# Patient Record
Sex: Female | Born: 1960 | Race: Black or African American | Hispanic: No | Marital: Single | Smoking: Never smoker
Health system: Southern US, Community
[De-identification: ages and names within clinical notes are randomized; demographics above are authoritative.]

## PROBLEM LIST (undated history)

## (undated) DIAGNOSIS — E039 Hypothyroidism, unspecified: Secondary | ICD-10-CM

## (undated) DIAGNOSIS — E119 Type 2 diabetes mellitus without complications: Secondary | ICD-10-CM

## (undated) DIAGNOSIS — T7840XA Allergy, unspecified, initial encounter: Secondary | ICD-10-CM

## (undated) HISTORY — PX: NASAL SINUS SURGERY: SHX719

## (undated) HISTORY — DX: Allergy, unspecified, initial encounter: T78.40XA

## (undated) HISTORY — DX: Type 2 diabetes mellitus without complications: E11.9

---

## 1997-09-02 ENCOUNTER — Other Ambulatory Visit: Admission: RE | Admit: 1997-09-02 | Discharge: 1997-09-02 | Payer: Self-pay | Admitting: Obstetrics and Gynecology

## 1997-12-24 ENCOUNTER — Ambulatory Visit (HOSPITAL_COMMUNITY): Admission: RE | Admit: 1997-12-24 | Discharge: 1997-12-24 | Payer: Self-pay | Admitting: Obstetrics and Gynecology

## 1997-12-24 ENCOUNTER — Encounter: Payer: Self-pay | Admitting: Obstetrics and Gynecology

## 1998-09-23 ENCOUNTER — Other Ambulatory Visit: Admission: RE | Admit: 1998-09-23 | Discharge: 1998-09-23 | Payer: Self-pay | Admitting: Obstetrics and Gynecology

## 1999-05-14 ENCOUNTER — Encounter: Payer: Self-pay | Admitting: Obstetrics and Gynecology

## 1999-05-14 ENCOUNTER — Ambulatory Visit (HOSPITAL_COMMUNITY): Admission: RE | Admit: 1999-05-14 | Discharge: 1999-05-14 | Payer: Self-pay | Admitting: Obstetrics and Gynecology

## 1999-09-23 ENCOUNTER — Other Ambulatory Visit: Admission: RE | Admit: 1999-09-23 | Discharge: 1999-09-23 | Payer: Self-pay | Admitting: Obstetrics and Gynecology

## 1999-12-11 ENCOUNTER — Ambulatory Visit (HOSPITAL_COMMUNITY): Admission: RE | Admit: 1999-12-11 | Discharge: 1999-12-11 | Payer: Self-pay | Admitting: Obstetrics and Gynecology

## 1999-12-11 ENCOUNTER — Encounter (INDEPENDENT_AMBULATORY_CARE_PROVIDER_SITE_OTHER): Payer: Self-pay | Admitting: Specialist

## 2000-06-07 ENCOUNTER — Encounter: Payer: Self-pay | Admitting: Obstetrics and Gynecology

## 2000-06-07 ENCOUNTER — Ambulatory Visit (HOSPITAL_COMMUNITY): Admission: RE | Admit: 2000-06-07 | Discharge: 2000-06-07 | Payer: Self-pay | Admitting: Obstetrics and Gynecology

## 2000-10-03 ENCOUNTER — Other Ambulatory Visit: Admission: RE | Admit: 2000-10-03 | Discharge: 2000-10-03 | Payer: Self-pay | Admitting: Obstetrics and Gynecology

## 2001-07-06 ENCOUNTER — Ambulatory Visit (HOSPITAL_COMMUNITY): Admission: RE | Admit: 2001-07-06 | Discharge: 2001-07-06 | Payer: Self-pay | Admitting: Obstetrics and Gynecology

## 2001-07-06 ENCOUNTER — Encounter: Payer: Self-pay | Admitting: Obstetrics and Gynecology

## 2001-11-17 ENCOUNTER — Other Ambulatory Visit: Admission: RE | Admit: 2001-11-17 | Discharge: 2001-11-17 | Payer: Self-pay | Admitting: Obstetrics and Gynecology

## 2002-04-16 ENCOUNTER — Encounter: Payer: Self-pay | Admitting: *Deleted

## 2002-04-16 ENCOUNTER — Ambulatory Visit (HOSPITAL_COMMUNITY): Admission: RE | Admit: 2002-04-16 | Discharge: 2002-04-16 | Payer: Self-pay | Admitting: *Deleted

## 2002-08-09 ENCOUNTER — Encounter: Payer: Self-pay | Admitting: Obstetrics and Gynecology

## 2002-08-09 ENCOUNTER — Ambulatory Visit (HOSPITAL_COMMUNITY): Admission: RE | Admit: 2002-08-09 | Discharge: 2002-08-09 | Payer: Self-pay | Admitting: Obstetrics and Gynecology

## 2003-09-02 ENCOUNTER — Other Ambulatory Visit: Admission: RE | Admit: 2003-09-02 | Discharge: 2003-09-02 | Payer: Self-pay | Admitting: Obstetrics and Gynecology

## 2004-08-17 ENCOUNTER — Encounter (INDEPENDENT_AMBULATORY_CARE_PROVIDER_SITE_OTHER): Payer: Self-pay | Admitting: *Deleted

## 2004-08-17 ENCOUNTER — Ambulatory Visit (HOSPITAL_COMMUNITY): Admission: RE | Admit: 2004-08-17 | Discharge: 2004-08-18 | Payer: Self-pay | Admitting: Otolaryngology

## 2004-08-28 ENCOUNTER — Ambulatory Visit (HOSPITAL_BASED_OUTPATIENT_CLINIC_OR_DEPARTMENT_OTHER): Admission: RE | Admit: 2004-08-28 | Discharge: 2004-08-28 | Payer: Self-pay | Admitting: Otolaryngology

## 2004-09-06 ENCOUNTER — Ambulatory Visit: Payer: Self-pay | Admitting: Internal Medicine

## 2004-10-05 ENCOUNTER — Other Ambulatory Visit: Admission: RE | Admit: 2004-10-05 | Discharge: 2004-10-05 | Payer: Self-pay | Admitting: Obstetrics and Gynecology

## 2004-12-07 ENCOUNTER — Emergency Department (HOSPITAL_COMMUNITY): Admission: EM | Admit: 2004-12-07 | Discharge: 2004-12-07 | Payer: Self-pay | Admitting: Family Medicine

## 2004-12-30 ENCOUNTER — Other Ambulatory Visit: Admission: RE | Admit: 2004-12-30 | Discharge: 2004-12-30 | Payer: Self-pay | Admitting: Obstetrics and Gynecology

## 2006-11-01 HISTORY — PX: ABDOMINAL HYSTERECTOMY: SHX81

## 2006-11-14 ENCOUNTER — Ambulatory Visit (HOSPITAL_COMMUNITY): Admission: RE | Admit: 2006-11-14 | Discharge: 2006-11-15 | Payer: Self-pay | Admitting: Obstetrics and Gynecology

## 2006-11-14 ENCOUNTER — Encounter (INDEPENDENT_AMBULATORY_CARE_PROVIDER_SITE_OTHER): Payer: Self-pay | Admitting: Obstetrics and Gynecology

## 2008-09-13 DIAGNOSIS — N809 Endometriosis, unspecified: Secondary | ICD-10-CM | POA: Insufficient documentation

## 2008-09-13 DIAGNOSIS — J329 Chronic sinusitis, unspecified: Secondary | ICD-10-CM | POA: Insufficient documentation

## 2008-11-20 ENCOUNTER — Encounter (INDEPENDENT_AMBULATORY_CARE_PROVIDER_SITE_OTHER): Payer: Self-pay | Admitting: *Deleted

## 2010-09-18 NOTE — Procedures (Signed)
NAME:  Nicole Gallagher, Nicole Gallagher                ACCOUNT NO.:  [id number]   MEDICAL RECORD NO.:  [id number]          PATIENT TYPE:  OUT   LOCATION:  SLEEP CENTER                 FACILITY:  [facility]   PHYSICIAN:  Clinton D. Maple Hudson, M.D. DATE OF BIRTH:  07-30-1960   DATE OF STUDY:  08/28/2004                              NOCTURNAL POLYSOMNOGRAM   STUDY DATE:  August 28, 2004   REFERRING PHYSICIAN:  Dr. Hermelinda Medicus   INDICATION FOR STUDY:  Hypersomnia with sleep apnea.  Epworth Sleepiness  Score 13/24, BMI 27, weight 153 pounds.   SLEEP ARCHITECTURE:  Total sleep time 332 minutes with sleep efficiency 91%.  Stage I was 5%, stage II 58%, stages III and IV 15%, REM was 22% of total  sleep time.  Sleep latency 2.5 minutes, REM latency 73 minutes, awake after  sleep onset 32 minutes, arousal index increased at 33.   RESPIRATORY DATA:  NPSG protocol.  Respiratory disturbance index (RDI, AHI)  3.4 obstructive events per hour indicating 3.4 obstructive events per hour  which is within normal limits.  There were 2 central apneas, 7 obstructive  apneas and 10 hypopneas.  Events were not positional.  REM RDI 9.   OXYGEN DATA:  Moderate snoring with oxygen desaturation to a nadir of 92%.  Mean oxygen saturation was 98% on room air.   CARDIAC DATA:  Normal sinus rhythm.   MOVEMENT/PARASOMNIA:  Occasional leg jerk, insignificant.   IMPRESSION/RECOMMENDATION:  1.  Occasional obstructive respiratory events, respiratory disturbance index      3.4 per hour which is within normal limits for an adult.  2.  Moderate snoring with normal oxygenation.      CDY/MEDQ  D:  09/06/2004 10:31:31  T:  09/06/2004 11:52:26  Job:  [id number]

## 2010-09-18 NOTE — Op Note (Signed)
NAME:  Nicole Gallagher, Nicole Gallagher                ACCOUNT NO.:  [id number]   MEDICAL RECORD NO.:  [id number]          PATIENT TYPE:  AMB   LOCATION:  SDC                           FACILITY:  [facility]   PHYSICIAN:  Guy Sandifer. Henderson Cloud, M.D. DATE OF BIRTH:  12/05/1960   DATE OF PROCEDURE:  11/14/2006  DATE OF DISCHARGE:                               OPERATIVE REPORT   PREOPERATIVE DIAGNOSES:  1. Uterine leiomyomata.  2. Menometrorrhagia.   POSTOPERATIVE DIAGNOSES:  1. Uterine leiomyomata.  2. Menometrorrhagia.  3. Pelvic adhesions.   PROCEDURES:  Laparoscopically-assisted vaginal hysterectomy and lysis of  adhesions.   SURGEON:  Harold Hedge, MD   ASSISTANT:  Candice Camp, MD   ANESTHESIA:  General with endotracheal intubation, Cristela Blue, MD   SPECIMENS:  Uterus to pathology.   ESTIMATED BLOOD LOSS:  150 mL   INDICATIONS AND CONSENT:  This patient is a 50 year old single black  female, G0, P0 with known endometriosis status post uterine myomectomy  with recurrent heavy bleeding.  After discussing the options, she is  admitted for laparoscopically-assisted vaginal hysterectomy and removal  of ovaries only if they distinctly abnormal.  Potential risks and  complications have been discussed preoperatively including but not  limited to infection, organ damage, bleeding requiring transfusion of  blood products with possible transfusion reaction, HIV and hepatitis  acquisition, DVT, PE, pneumonia, fistula formation, postoperative  dyspareunia, pelvic pain, and laparotomy.  All questions have been  answered, and consent is signed on the chart.   FINDINGS:  There is some omental adhesions to the anterior abdominal  wall above the level of the umbilicus.  This does not form any closed  loops.  In the pelvis, the uterus is about 6 weeks in size with at least  one 3 cm subserosal leiomyomata in the right uterine fundus.  They are  adherent to the pelvic sidewalls bilaterally.  There are adhesions of  the small bowel to the left uterine fundus.   PROCEDURE:  The patient is taken to the operating room where she is  identified, placed in dorsal supine position, and general anesthesia is  induced via endotracheal intubation.  She is then placed in the dorsal  lithotomy position where she is prepped, abdominally and vaginally,  bladder is straight-catheterized, and a Hulka tenaculum is placed in the  uterus as a manipulator.  She is draped in a sterile fashion.  The  infraumbilical and suprapubic areas injected in the midline with 0.5%  plain Marcaine.  An infraumbilical incision is made.  A disposable  Veress needle is then placed with a normal syringe and drop test.  Two  liters of gas are insufflated under low pressure with good tympany in  the right upper quadrant.  The Veress needle is removed, and a 10-11 XL  bladeless disposable trocar sleeve is placed using direct visualization  with the diagnostic laparoscope.  After placement, the operative  laparoscope is placed.  A small suprapubic incision is made in the  midline, and a 5 mm XL bladeless disposable trocar sleeve is placed  under direct visualization without difficulty.  The  above findings are  noted.  The adhesions of the bowel which are filmy in nature to the left  uterine fundus are taken down sharply.  The ovaries are mobilized  bilaterally with sharp and blunt dissection.  Hemostasis is maintained  on the ovaries with bipolar cautery.  This frees up the proximal  ligaments bilaterally.  The gyrus bipolar cautery cutting instrument is  then used to take down the proximal ligaments bilaterally to the level  of the vesicouterine peritoneum.  The vesicouterine peritoneum is then  taken down cephalolaterally sharply.  Suprapubic trocar sleeve is  removed; instruments are removed, and attention is turned to the vagina.  Posterior cul-de-sac is entered sharply.  Cervix is circumscribed with  unipolar cautery.  Mucosa is advanced  sharply and bluntly.  Then using  the gyrus bipolar cautery instrument, the uterosacral ligaments followed  by the cardinal ligaments and the uterine vessels were taken down  bilaterally.  Fundus is delivered posteriorly, and the proximal  ligaments are taken down.  The uterosacral ligaments are then plicated  the vagina bilaterally with separate sutures of 0 Monocryl.  All suture  will be 0 Monocryl unless otherwise designated.  Uterosacral ligaments  are then plicated in the midline with a third suture, and the cuff is  closed with figure-of-eights.  Foley catheter is placed, and the bladder  is draining clear urine as noted.  Attention is returned to the abdomen.  Pneumoperitoneum is recreated, and the suprapubic trocar sleeve is  reinserted under direct visualization.  Minor bleeding at peritoneal  edges is controlled with bipolar cautery.  Copious irrigation is carried  out.  Inspection of the reduced pneumoperitoneum reveals good  hemostasis.  All excess fluid is removed, and the suprapubic trocar  sleeve is removed.  Pneumoperitoneum is completely reduced, and the  umbilical trocar sleeve is removed.  Skin is closed with Dermabond in  both incisions.  All counts correct.  The patient is awakened and taken  to the recovery room in stable condition.      Guy Sandifer Henderson Cloud, M.D.  Electronically Signed     JET/MEDQ  D:  11/14/2006  T:  11/14/2006  Job:  [id number]

## 2010-09-18 NOTE — H&P (Signed)
[facility]  Patient:    Nicole Gallagher, Nicole Gallagher                         MRN: [id number] Adm. Date:  12/11/99 Attending:  Guy Sandifer. Arleta Creek, M.D.                         History and Physical  CHIEF COMPLAINT:              Premenstrual pain and menorrhagia.  HISTORY OF PRESENT ILLNESS:   The patient is a 50 year old single black female G0, P0 not current sexually active status post uterine myomectomy in 1998 with recurrent menorrhagia. Pelvic ultrasound with sonohysterogram on October 19, 1999 reveals a uterus measuring 8.2 x 6.3 x 4.3 cm. At least one 11 mm mass consistent with a leiomyomata seen on the posterior uterine wall. The ovaries are essentially normal with a 13 mm simple cyst at the right ovary. Sonohysterogram reveals 2 separate masses measuring 14 and 11 mm. The patient also has increasing premenstrual pain. This requiring more and more nonsteroidal agents for only partial relief. In view of the above findings and symptoms, laparoscopy hysteroscopy with resectoscope and D*C has been scheduled.  PAST MEDICAL HISTORY:         1. Urinary tract infection.                               2. Anemia in the past.  PAST SURGICAL HISTORY:        1. Cyst on perineum resected.                               2. Laparoscopy with uterine myomectomy 1998.  OBSTETRIC HISTORY:            Negative.  FAMILY HISTORY:               Sister has twins. Two maternal uncles are twins. Diabetes in maternal grandmother and paternal grandfather. Ovarian cancer in mother. Pancreatic cancer in maternal uncle, chronic hypertension in maternal aunt, 2 maternal uncles, 1 paternal uncle, coronary artery disease in paternal grandfather.  SOCIAL HISTORY:               The patient denies tobacco, alcohol or drug abuse.  MEDICATIONS:                  Allegra p.r.n.  ALLERGIES:                    No known drug allergies.  REVIEW OF SYSTEMS:            Negative except as  above.  PHYSICAL EXAMINATION:  VITAL SIGNS:                  Height 5 feet 2 inches, weight 155 pounds. Blood pressure 110/60.  HEENT:                        Without thyromegaly.  LUNGS:                        Clear to auscultation.  HEART:  Regular rate and rhythm.  BACK:                         Without CVA tenderness.  BREASTS:                      Without masses, traction or discharge.  ABDOMEN:                      Soft, nontender without masses.  PELVIC:                       Vulva, vagina and cervix without lesions. Uterus anteverted 6 weeks in size, mobile nontender. Adnexa nontender without masses.  EXTREMITIES:                  Neurological exam grossly within normal limits.  ASSESSMENT:                   1. Premenstrual pain and dysmenorrhea.                               2. Menorrhagia with possible endometrial mass.  PLAN:                         Hysteroscopy with resectoscope D&C and laparoscopy. DD:  12/09/99 TD:  12/09/99 Job: [id number]

## 2010-09-18 NOTE — Op Note (Signed)
[name], Nicole Gallagher                ACCOUNT NO.:  [id number]   MEDICAL RECORD NO.:  [id number]          PATIENT TYPE:  OIB   LOCATION:  2899                         FACILITY:  [facility]   PHYSICIAN:  Hermelinda Medicus, M.D.   DATE OF BIRTH:  02-13-1961   DATE OF PROCEDURE:  08/17/2004  DATE OF DISCHARGE:                                 OPERATIVE REPORT   PREOPERATIVE DIAGNOSIS:  Bilateral ethmoid and maxillary sinusitis with  turbinate hypertrophy, with nasal obstruction, with snoring.   POSTOPERATIVE DIAGNOSIS:  Bilateral ethmoid and maxillary sinusitis with  turbinate hypertrophy, with nasal obstruction, with snoring.   OPERATION:  Turbinate reduction with functional endoscopic sinus surgery,  bilateral ethmoidectomy, bilateral maxillary sinus ostial enlargement.   OPERATOR:  Hermelinda Medicus, M.D.   ANESTHESIA:  local MAC with Dr. __________.   PROCEDURE:  The patient was placed in the supine position and under local  MAC anesthesia, the nose was anesthetized using 1% Xylocaine with  epinephrine and topical cocaine 200 mg.  The turbinates were reduced  aggressively.  Inferior turbinates were addressed using the bipolar cautery,  and the middle turbinates were reduced using the polyp forceps.  No mucous  membrane was removed.  The ethmoid sinus was first approached using the 0  degree scope and the straight and upbiting Blakesley-Wildes.  Once the  polyps were removed from this region, the maxillary sinus was approached and  there was very thick mucous membrane and scarring over the natural ostium,  which was corrected by using the angled Blakesley-Wildes and the backbiting  forceps, and we were able to see within the sinus and suctioned a  considerable amount of debris from the sinus as well as removed polyps using  the 0 and 70 degree scope.  Once this was achieved, we approached the right  side, again reducing the turbinate, pushing it medial, the middle turbinate,  and then removing  a polyp which was obvious moving into the nose and then  removing polyps from the ethmoid sinus, which was totally obstructed with  this polypoid debris, especially anteriorly.  Once we were able to the sinus  after removal of polyps, we worked our way posteriorly using the 0 and 70  degree scope and then once this was cleared, we focused on the maxillary  sinus, where the natural ostium was also blocked totally with thick  membrane, and this was corrected by using the backbiting and the angled  Blakesley-Wilde and the 0 and 70 degree scope for visualization.  Once this  was achieved, the sinus was suctioned of this mucus and debris and then the  Gelfoam was placed within the ethmoid sinus, followed Gelfilm, followed with  tonsillar packing which we will remove this afternoon, and the patient will  be observed overnight because of her  history of snoring and a questionable sleep apnea element.  The patient  tolerated the procedure very well and is doing well postop.   Her follow-up will be then in one week, two weeks, three weeks, six weeks,  three months, six months, and a  year.  JC/MEDQ  D:  08/17/2004  T:  08/17/2004  Job:  [id number]   cc:   Berdine Dance, M.D.   Guy Sandifer Arleta Creek, M.D.  [address]  [place]  [place] [address]  Fax: [phone]

## 2010-09-18 NOTE — H&P (Signed)
NAMEHADLYN, Nicole Gallagher                ACCOUNT NO.:  [id number]   MEDICAL RECORD NO.:  [id number]          PATIENT TYPE:  OIB   LOCATION:  2899                         FACILITY:  [facility]   PHYSICIAN:  Hermelinda Medicus, M.D.   DATE OF BIRTH:  1960/11/23   DATE OF ADMISSION:  08/17/2004  DATE OF DISCHARGE:                                HISTORY & PHYSICAL   HISTORY OF PRESENT ILLNESS:  This patient is a 50 year old female who works  at [employer] who has had a considerable amount of snoring with  nosebleed problems and some mild septal deviation to the left, but her major  problem is that of sinusitis.  She has been on antibiotics on several  occasions and we have used Neosporin ointment and AirGel to bring the mild  anterior epistaxis under control, but her sinus problems continue to be a  difficulty.  She has been on Ketek most recently to resolve this, and a CT  of her sinuses was obtained on 3/31 showing bilateral maxillary and left  ethmoid and some right ethmoid disease and also some mild sphenoid and  frontal sinus congestion.  However, the x-rays plus her history would  indicate ethmoid and maxillary sinus surgical disease where the sinuses were  essentially three-quarters obstructed and filled with mucus.   PAST MEDICAL HISTORY:  Quite unremarkable.  Hiatal hernia history.   ALLERGIES:  No allergies to medications.   SOCIAL HISTORY:  She never smoke or drank.   PAST SURGICAL HISTORY:  She had a myomectomy in 1998 due to heavy menses and  had some anemia secondary to that.   MEDICATIONS:  Loestrin daily.   PHYSICAL EXAMINATION:  VITAL SIGNS:  Blood pressure of 133/80, pulse 103,  respirations 20.  She weighs 155, 63 inches tall.  CHEST:  Clear.  No rales, rhonchi, wheezes.  CARDIOVASCULAR:  No murmurs, rubs or gallops.  HEENT:  Her ears are clear.  Her nose shows some polypoid changes around the  middle turbinate region.  Turbinates are quite large and obstructive.  NECK:   Free of any thyromegaly, cervical adenopathy or mass.  No  abnormalities of the larynx or pharynx.  True cords, false cords,  epiglottis, base of tongue, gag reflux, EOMs of patient are overall  symmetrical.  EXTREMITIES:  Unremarkable.   INITIAL DIAGNOSES:  1.  Bilateral ethmoid and maxillary sinusitis with some thickening of the      sphenoid and frontal sinus mucous membrane.  2.  History of using Loestrin.  3.  History of mild snoring with some daytime somnolence.   PLAN:  Do bilateral ethmoid and maxillary sinus surgery via functional  endoscopic sinus surgery and a turbinate reduction.  Hopefully this will  help her snoring as well as help her sinus disease.      JC/MEDQ  D:  08/17/2004  T:  08/17/2004  Job:  [id number]   cc:   Guy Sandifer. Arleta Creek, M.D.  [address]  [place]  [place] [address]  Fax: [phone]   Kennyth Arnold, M.D.  [facility]

## 2010-09-18 NOTE — Op Note (Signed)
[facility]  Patient:    Nicole Gallagher, Nicole Gallagher                       MRN: [id number] Proc. Date: 12/11/99 Adm. Date:  [id number] Disc. Date: [id number] Attending:  Cordelia Pen Ii                           Operative Report  PREOPERATIVE DIAGNOSES:       1. Menorrhagia.                               2. Pelvic pain.  POSTOPERATIVE DIAGNOSES:      1. Endometriosis.                               2. Dense pelvic adhesions.                               3. Endometrial polyps.  PROCEDURE:                    1. Laparoscopy with extensive lysis of                                  adhesions.                               2. Ablation of endometriosis.                               3. Hysteroscopy.                               4. Dilatation and curettage.  SURGEON:                      Guy Sandifer. Arleta Creek, M.D.  ANESTHESIA:                   General with endotracheal intubation.  ESTIMATED BLOOD LOSS:         Less than 50 cc.  I&Os:                        Sorbitol distending media, 150 cc deficit.  INDICATIONS AND CONSENT:      The patient is a 50 year old single white female, G0, P0, with menorrhagia and pelvic pain.  Details are dictated in the history and physical.  Hysteroscopy, D&C, and laparoscopy was discussed with the patient.  The possible risks and complications were discussed including, but not limited to infection, bowel, bladder or ureteral damage, bleeding requiring transfusion of blood products or possible transfusion reaction, HIV and hepatitis acquisition, DVT, PE, pneumonia, and hysterectomy.   All questions are answered and consent is signed on the chart.  FINDING:                      Endometrial canal contains a single 15 mm polypoid-type structure, arising from the upper center posterior endometrial cavity.  Abdominally, the upper abdomen  is normal.  There are some omental adhesion.  The uterus contains at least 1-2 cm subserosal  fibroid on the right fundus.  There are multiple adhesions at the plica omentum, as well as loops of bowel to the left fundus, left round ligament, and posterior fundus.  There are implants of white puckered and dark black endometriosis in the posterior cul-de-sac.  The fimbria are free bilaterally.  The left fallopian tube is somewhat adherent behind the left ovary.  The left ovary is partially adherent to the left pelvic sidewall.  The right ovary is also adherent to the right pelvic sidewall.  The right fallopian tube is free along its entire course, with normal fimbria.  DESCRIOTION OF PROCEDURE:     The patient was taken to the operating room, placed in the supine position with general anesthesia being induced via endotracheal intubation.  She was then placed in the dorsolithotomy position, where she was prepped abdominally and vaginally.  Bladder was catheterized. She was draped in a sterile fashion.  The bivalve speculum was placed in the vagina and the anterior cervical lip is grasped with a single-tooth tenaculum. Cervix is then gently and progressively dilated to a 29 Pratt dilator. Diagnostic hysteroscope is placed in the cervix and advanced under direct visualization using Sorbitol distending media.  The above findings are noted. The diagnostic hysteroscope is removed.  Cervix is dilated to a 33 Pratt dilator, and the resectoscope is placed and advanced under direct visualization.  The field could not be cleared to a satisfactory point to safely use the resecting loop.  Therefore the hysteroscope is withdrawn and sharp curettage is carried out, productive of the polyp and a large amount of tissue.  The diagnostic hysteroscope is then reintroduced and careful inspection reveals that the polyp has been completely removed.  The Hulka tenaculum was then placed in the cervix, and the single-tooth tenaculum is removed.  The speculum is removed.  Attention is turned to the abdomen.   A small infraumbilical incision is made and a 10 mm disposable trocar sleeve is placed.  It is felt to be placed amongst some adhesions of the omentum.  The trocar sleeve is removed and then replaced again.  Pneumoperitoneum is induced, and the adhesions of the omentum to the anterior abdominal wall are noted.  A small suprapubic incision is made and a 5 mm nondisposable trocar sleeve was placed under direct visualization.  Using the 5 mm laparoscope through the inferior trocar sleeve and then looking back up into the abdomen, it can be noted that there are some filmy adhesions of the omentum to the anterior abdominal wall in the region of the umbilicus.  There are no loops of bowel involved in this.  There is no bleeding and no closed loops.  Returning to the operative laparoscope, the above findings are noted.  A small left lower quadrant incision is made and another 5 mm nondisposable trocar sleeve is placed under direct visualization. Then the adhesions to the uterus are removed, with primarily sharp and some blunt dissection.  Good hemostasis is maintained.  Careful inspection reveals the bowel to be intact through all of this.  The right ovary is then freed from the right pelvic sidewall sharply and bluntly; a small endometrioma is entered while doing this.  After carefully checking for the course of the right ureter, the endometrial implants at the cervical insertion of the right ureterosacral ligament, as well as the posterior cul-de-sac are ablated with bipolar cautery.  Implants  of endometriosis in the right ovary are also cauterized.  The adhesions to the left fallopian tube are taken down sharply as well.  Bipolar cautery on the uterine fundus is used to obtain complete hemostasis.  Excess fluid is removed after copious irrigation.  INTERCEED is then backloaded through the microscope, and is placed on the uterine fundus and slightly moistened. Excess fluid is removed.   Suprapubic and left lower quadrant trocar sleeves are removed.  The pneumoperitoneum is reduced and no bleeding is noted. Pneumoperitoneum is completely reduced.  The umbilical trocar sleeve is  removed.  Skin incisions are closed with subcuticular mattress 3-0 Vicryl sutures.  The incisions are then injected with 0.5% plain Marcaine.  Dressings are applied.  Hulka tenaculum is removed; no bleeding is noted.  All counts are correct.  The patient is awakened and taken to the recovery room in stable condition. DD:  12/11/99 TD:  12/12/99 Job: [id number]

## 2010-09-18 NOTE — H&P (Signed)
NAME:  Nicole Gallagher, Nicole Gallagher                ACCOUNT NO.:  [id number]   MEDICAL RECORD NO.:  [id number]          PATIENT TYPE:  AMB   LOCATION:  [facility]                           FACILITY:  [facility]   PHYSICIAN:  Guy Sandifer. Henderson Cloud, M.D. DATE OF BIRTH:  1961-04-05   DATE OF ADMISSION:  11/14/2006  DATE OF DISCHARGE:                              HISTORY & PHYSICAL   CHIEF COMPLAINT:  Heavy, irregular menses.   HISTORY OF PRESENT ILLNESS:  The patient is a 50 year old single black  female, G0 P0 with known endometriosis, status post uterine bilobectomy  who continues to have heavy, irregular menses and menstrual pain.  Ultrasound in my office on 08/19/2006 reveals a uterus measuring 9.3 x  4.6 x 6.7 cm.  There are multiple leiomyomata ranging from 1.4 to 1.9  cm.  Sonohysterogram is consistent with an 8 and a 6 mm process that is  possibly submucous fibroids versus polyps.  After discussion of the  options, she is being admitted for laparoscopically-assisted vaginal  hysterectomy and removal of one or both ovaries only if abnormal.  Potential risks and complications have been reviewed preoperatively.   PAST MEDICAL HISTORY:  1. Endometriosis.  2. Sinusitis.   PAST SURGICAL HISTORY:  1. Sinus surgery 2006.  2. Laparoscopy with lysis of adhesions and hysteroscopy in 2001 and      uterine bilobectomy in 1998.   MEDICATIONS:  None.   ALLERGIES:  No known drug allergies.   SOCIAL HISTORY:  Denies tobacco, alcohol, or drug abuse.   FAMILY HISTORY:  Sister had twins, 2 maternal uncles are twins, diabetes  in maternal grandmother and paternal grandfather, ovarian cancer in  mother, pancreatic cancer in maternal uncle, chronic hypertension in  maternal aunt, two maternal uncles, one paternal uncle, coronary artery  disease in paternal grandfather.   PHYSICAL EXAMINATION:  Height 5 feet 3 inches, weight 162.6 pound, blood  pressure 138/74.  HEENT:  Without thyromegaly.  LUNGS:  Clear to auscultation.  HEART:  Regular rate and rhythm.  BACK:  Without CVA tenderness.  BREASTS:  Without mass or active discharge.  ABDOMEN:  Soft, nontender, without masses.  PELVIC EXAM:  Vulva, vagina, cervix without lesions.  Uterus is regular  contour, mobile, nontender.  Adnexa nontender, without masses.  EXTREMITIES:  Grossly within normal limits.  NEUROLOGICAL EXAM:  Within normal limits.   ASSESSMENT:  Metromenorrhagia with uterine leiomyomata and history of  endometriosis.   PLAN:  Laparoscopically assisted vaginal hysterectomy with removal of  one or both ovaries if abnormal.      Guy Sandifer. Henderson Cloud, M.D.  Electronically Signed     JET/MEDQ  D:  11/03/2006  T:  11/03/2006  Job:  [id number]

## 2011-02-15 LAB — CBC
HCT: 26 — ABNORMAL LOW
Platelets: 444 — ABNORMAL HIGH
RDW: 19.3 — ABNORMAL HIGH

## 2011-02-16 LAB — COMPREHENSIVE METABOLIC PANEL
ALT: 12
AST: 19
Albumin: 3.6
Alkaline Phosphatase: 110
GFR calc Af Amer: >60
Glucose, Bld: 105 — ABNORMAL HIGH
Potassium: 3.7
Sodium: 135
Total Protein: 7.5

## 2011-02-16 LAB — CBC
Hemoglobin: 9.4 — ABNORMAL LOW
RDW: 19 — ABNORMAL HIGH

## 2011-02-16 LAB — HCG, SERUM, QUALITATIVE: Preg, Serum: NEGATIVE

## 2013-06-22 ENCOUNTER — Other Ambulatory Visit: Payer: Self-pay

## 2014-09-16 ENCOUNTER — Ambulatory Visit (INDEPENDENT_AMBULATORY_CARE_PROVIDER_SITE_OTHER): Payer: BLUE CROSS/BLUE SHIELD | Admitting: Internal Medicine

## 2014-09-16 ENCOUNTER — Other Ambulatory Visit (INDEPENDENT_AMBULATORY_CARE_PROVIDER_SITE_OTHER): Payer: BLUE CROSS/BLUE SHIELD

## 2014-09-16 ENCOUNTER — Encounter: Payer: Self-pay | Admitting: Internal Medicine

## 2014-09-16 VITALS — BP 114/72 | HR 96 | Temp 98.0°F | Resp 12 | Ht 62.0 in | Wt 160.0 lb

## 2014-09-16 DIAGNOSIS — J329 Chronic sinusitis, unspecified: Secondary | ICD-10-CM | POA: Diagnosis not present

## 2014-09-16 DIAGNOSIS — Z0000 Encounter for general adult medical examination without abnormal findings: Secondary | ICD-10-CM

## 2014-09-16 DIAGNOSIS — R5383 Other fatigue: Secondary | ICD-10-CM

## 2014-09-16 LAB — COMPREHENSIVE METABOLIC PANEL
ALK PHOS: 178 U/L — AB (ref 39–117)
ALT: 16 U/L (ref 0–35)
AST: 11 U/L (ref 0–37)
Albumin: 4.1 g/dL (ref 3.5–5.2)
BILIRUBIN TOTAL: 0.6 mg/dL (ref 0.2–1.2)
BUN: 9 mg/dL (ref 6–23)
CO2: 29 mEq/L (ref 19–32)
CREATININE: 0.59 mg/dL (ref 0.40–1.20)
Calcium: 9.9 mg/dL (ref 8.4–10.5)
Chloride: 105 mEq/L (ref 96–112)
GFR: 136.65 mL/min (ref >60.00)
Glucose, Bld: 179 mg/dL — ABNORMAL HIGH (ref 70–99)
Potassium: 4.5 mEq/L (ref 3.5–5.1)
Sodium: 140 mEq/L (ref 135–145)
TOTAL PROTEIN: 7.2 g/dL (ref 6.0–8.3)

## 2014-09-16 LAB — CBC
HCT: 39.4 % (ref 36.0–46.0)
HEMOGLOBIN: 12.8 g/dL (ref 12.0–15.0)
MCHC: 32.4 g/dL (ref 30.0–36.0)
MCV: 70.8 fl — AB (ref 78.0–100.0)
PLATELETS: 341 10*3/uL (ref 150.0–400.0)
RBC: 5.56 Mil/uL — ABNORMAL HIGH (ref 3.87–5.11)
RDW: 16.8 % — AB (ref 11.5–15.5)
WBC: 4.4 10*3/uL (ref 4.0–10.5)

## 2014-09-16 LAB — TSH: TSH: 0.49 u[IU]/mL (ref 0.35–4.50)

## 2014-09-16 LAB — LIPID PANEL
CHOL/HDL RATIO: 4
Cholesterol: 255 mg/dL — ABNORMAL HIGH (ref 0–200)
HDL: 70.7 mg/dL (ref >39.00)
LDL Cholesterol: 154 mg/dL — ABNORMAL HIGH (ref 0–99)
NonHDL: 184.3
Triglycerides: 153 mg/dL — ABNORMAL HIGH (ref 0.0–149.0)
VLDL: 30.6 mg/dL (ref 0.0–40.0)

## 2014-09-16 LAB — HEMOGLOBIN A1C: HEMOGLOBIN A1C: 10.8 % — AB (ref 4.6–6.5)

## 2014-09-16 LAB — VITAMIN B12: Vitamin B-12: 592 pg/mL (ref 211–911)

## 2014-09-16 LAB — FOLATE: Folate: 12.5 ng/mL (ref >5.9)

## 2014-09-16 MED ORDER — LEVOCETIRIZINE DIHYDROCHLORIDE 5 MG PO TABS: 5.0000 mg | ORAL_TABLET | Freq: Every evening | ORAL | Status: DC

## 2014-09-16 NOTE — Patient Instructions (Signed)
We have sent in a prescription allergy medicine called xyzal which is stronger than zyrtec over the counter. Take 1 pill a day for the allergies and it may take 3-5 days to take full effect.   We will check on your blood work today and call you back with the results.   We will get your records as well.   If you are doing well you can come back once a year. If you have any new problems or questions please feel free to call the office.   Health Maintenance Adopting a healthy lifestyle and getting preventive care can go a long way to promote health and wellness. Talk with your health care provider about what schedule of regular examinations is right for you. This is a good chance for you to check in with your provider about disease prevention and staying healthy. In between checkups, there are plenty of things you can do on your own. Experts have done a lot of research about which lifestyle changes and preventive measures are most likely to keep you healthy. Ask your health care provider for more information. WEIGHT AND DIET  Eat a healthy diet  Be sure to include plenty of vegetables, fruits, low-fat dairy products, and lean protein.  Do not eat a lot of foods high in solid fats, added sugars, or salt.  Get regular exercise. This is one of the most important things you can do for your health.  Most adults should exercise for at least 150 minutes each week. The exercise should increase your heart rate and make you sweat (moderate-intensity exercise).  Most adults should also do strengthening exercises at least twice a week. This is in addition to the moderate-intensity exercise.  Maintain a healthy weight  Body mass index (BMI) is a measurement that can be used to identify possible weight problems. It estimates body fat based on height and weight. Your health care provider can help determine your BMI and help you achieve or maintain a healthy weight.  For females 9 years of age and older:    A BMI below 18.5 is considered underweight.  A BMI of 18.5 to 24.9 is normal.  A BMI of 25 to 29.9 is considered overweight.  A BMI of 30 and above is considered obese.  Watch levels of cholesterol and blood lipids  You should start having your blood tested for lipids and cholesterol at 54 years of age, then have this test every 5 years.  You may need to have your cholesterol levels checked more often if:  Your lipid or cholesterol levels are high.  You are older than 54 years of age.  You are at high risk for heart disease.  CANCER SCREENING   Lung Cancer  Lung cancer screening is recommended for adults 72-58 years old who are at high risk for lung cancer because of a history of smoking.  A yearly low-dose CT scan of the lungs is recommended for people who:  Currently smoke.  Have quit within the past 15 years.  Have at least a 30-pack-year history of smoking. A pack year is smoking an average of one pack of cigarettes a day for 1 year.  Yearly screening should continue until it has been 15 years since you quit.  Yearly screening should stop if you develop a health problem that would prevent you from having lung cancer treatment.  Breast Cancer  Practice breast self-awareness. This means understanding how your breasts normally appear and feel.  It also means  doing regular breast self-exams. Let your health care provider know about any changes, no matter how small.  If you are in your 20s or 30s, you should have a clinical breast exam (CBE) by a health care provider every 1-3 years as part of a regular health exam.  If you are 74 or older, have a CBE every year. Also consider having a breast X-ray (mammogram) every year.  If you have a family history of breast cancer, talk to your health care provider about genetic screening.  If you are at high risk for breast cancer, talk to your health care provider about having an MRI and a mammogram every year.  Breast  cancer gene (BRCA) assessment is recommended for women who have family members with BRCA-related cancers. BRCA-related cancers include:  Breast.  Ovarian.  Tubal.  Peritoneal cancers.  Results of the assessment will determine the need for genetic counseling and BRCA1 and BRCA2 testing. Cervical Cancer Routine pelvic examinations to screen for cervical cancer are no longer recommended for nonpregnant women who are considered low risk for cancer of the pelvic organs (ovaries, uterus, and vagina) and who do not have symptoms. A pelvic examination may be necessary if you have symptoms including those associated with pelvic infections. Ask your health care provider if a screening pelvic exam is right for you.   The Pap test is the screening test for cervical cancer for women who are considered at risk.  If you had a hysterectomy for a problem that was not cancer or a condition that could lead to cancer, then you no longer need Pap tests.  If you are older than 65 years, and you have had normal Pap tests for the past 10 years, you no longer need to have Pap tests.  If you have had past treatment for cervical cancer or a condition that could lead to cancer, you need Pap tests and screening for cancer for at least 20 years after your treatment.  If you no longer get a Pap test, assess your risk factors if they change (such as having a new sexual partner). This can affect whether you should start being screened again.  Some women have medical problems that increase their chance of getting cervical cancer. If this is the case for you, your health care provider may recommend more frequent screening and Pap tests.  The human papillomavirus (HPV) test is another test that may be used for cervical cancer screening. The HPV test looks for the virus that can cause cell changes in the cervix. The cells collected during the Pap test can be tested for HPV.  The HPV test can be used to screen women 99 years  of age and older. Getting tested for HPV can extend the interval between normal Pap tests from three to five years.  An HPV test also should be used to screen women of any age who have unclear Pap test results.  After 54 years of age, women should have HPV testing as often as Pap tests.  Colorectal Cancer  This type of cancer can be detected and often prevented.  Routine colorectal cancer screening usually begins at 54 years of age and continues through 54 years of age.  Your health care provider may recommend screening at an earlier age if you have risk factors for colon cancer.  Your health care provider may also recommend using home test kits to check for hidden blood in the stool.  A small camera at the end of a  tube can be used to examine your colon directly (sigmoidoscopy or colonoscopy). This is done to check for the earliest forms of colorectal cancer.  Routine screening usually begins at age 83.  Direct examination of the colon should be repeated every 5-10 years through 54 years of age. However, you may need to be screened more often if early forms of precancerous polyps or small growths are found. Skin Cancer  Check your skin from head to toe regularly.  Tell your health care provider about any new moles or changes in moles, especially if there is a change in a mole's shape or color.  Also tell your health care provider if you have a mole that is larger than the size of a pencil eraser.  Always use sunscreen. Apply sunscreen liberally and repeatedly throughout the day.  Protect yourself by wearing long sleeves, pants, a wide-brimmed hat, and sunglasses whenever you are outside. HEART DISEASE, DIABETES, AND HIGH BLOOD PRESSURE   Have your blood pressure checked at least every 1-2 years. High blood pressure causes heart disease and increases the risk of stroke.  If you are between 26 years and 101 years old, ask your health care provider if you should take aspirin to  prevent strokes.  Have regular diabetes screenings. This involves taking a blood sample to check your fasting blood sugar level.  If you are at a normal weight and have a low risk for diabetes, have this test once every three years after 54 years of age.  If you are overweight and have a high risk for diabetes, consider being tested at a younger age or more often. PREVENTING INFECTION  Hepatitis B  If you have a higher risk for hepatitis B, you should be screened for this virus. You are considered at high risk for hepatitis B if:  You were born in a country where hepatitis B is common. Ask your health care provider which countries are considered high risk.  Your parents were born in a high-risk country, and you have not been immunized against hepatitis B (hepatitis B vaccine).  You have HIV or AIDS.  You use needles to inject street drugs.  You live with someone who has hepatitis B.  You have had sex with someone who has hepatitis B.  You get hemodialysis treatment.  You take certain medicines for conditions, including cancer, organ transplantation, and autoimmune conditions. Hepatitis C  Blood testing is recommended for:  Everyone born from [birth year] through 1965.  Anyone with known risk factors for hepatitis C. Sexually transmitted infections (STIs)  You should be screened for sexually transmitted infections (STIs) including gonorrhea and chlamydia if:  You are sexually active and are younger than 54 years of age.  You are older than 54 years of age and your health care provider tells you that you are at risk for this type of infection.  Your sexual activity has changed since you were last screened and you are at an increased risk for chlamydia or gonorrhea. Ask your health care provider if you are at risk.  If you do not have HIV, but are at risk, it may be recommended that you take a prescription medicine daily to prevent HIV infection. This is called pre-exposure  prophylaxis (PrEP). You are considered at risk if:  You are sexually active and do not regularly use condoms or know the HIV status of your partner(s).  You take drugs by injection.  You are sexually active with a partner who has HIV. Talk with  your health care provider about whether you are at high risk of being infected with HIV. If you choose to begin PrEP, you should first be tested for HIV. You should then be tested every 3 months for as long as you are taking PrEP.  PREGNANCY   If you are premenopausal and you may become pregnant, ask your health care provider about preconception counseling.  If you may become pregnant, take 400 to 800 micrograms (mcg) of folic acid every day.  If you want to prevent pregnancy, talk to your health care provider about birth control (contraception). OSTEOPOROSIS AND MENOPAUSE   Osteoporosis is a disease in which the bones lose minerals and strength with aging. This can result in serious bone fractures. Your risk for osteoporosis can be identified using a bone density scan.  If you are 77 years of age or older, or if you are at risk for osteoporosis and fractures, ask your health care provider if you should be screened.  Ask your health care provider whether you should take a calcium or vitamin D supplement to lower your risk for osteoporosis.  Menopause may have certain physical symptoms and risks.  Hormone replacement therapy may reduce some of these symptoms and risks. Talk to your health care provider about whether hormone replacement therapy is right for you.  HOME CARE INSTRUCTIONS   Schedule regular health, dental, and eye exams.  Stay current with your immunizations.   Do not use any tobacco products including cigarettes, chewing tobacco, or electronic cigarettes.  If you are pregnant, do not drink alcohol.  If you are breastfeeding, limit how much and how often you drink alcohol.  Limit alcohol intake to no more than 1 drink per  day for nonpregnant women. One drink equals 12 ounces of beer, 5 ounces of wine, or 1 ounces of hard liquor.  Do not use street drugs.  Do not share needles.  Ask your health care provider for help if you need support or information about quitting drugs.  Tell your health care provider if you often feel depressed.  Tell your health care provider if you have ever been abused or do not feel safe at home. Document Released: 11/02/2010 Document Revised: 09/03/2013 Document Reviewed: 03/21/2013 [facility] Patient Information 2015 [place], [place]. This information is not intended to replace advice given to you by your health care provider. Make sure you discuss any questions you have with your health care provider.

## 2014-09-16 NOTE — Progress Notes (Signed)
   Subjective:    Patient ID: Nicole Gallagher, female    DOB: 1961/01/02, 54 y.o.   MRN: [id number]  HPI The patient is a 54 YO female who is coming in today new for fatigue. She sometimes feels like sleep helps but other times she wakes up tired. She denies constipation, cold intolerance. She does feel like she is urinating more lately but may have been drinking more water. She has been trying to increase her exercise. Denies chest pains, SOB, abdominal pain.   [facility], [facility] reviewed and updated with the patient.   Review of Systems  Constitutional: Positive for fatigue. Negative for fever, chills, activity change, appetite change and unexpected weight change.  HENT: Positive for congestion and sinus pressure. Negative for nosebleeds, postnasal drip, rhinorrhea, sore throat and trouble swallowing.   Eyes: Negative.   Respiratory: Negative for cough, chest tightness, shortness of breath and wheezing.   Cardiovascular: Negative for chest pain, palpitations and leg swelling.  Gastrointestinal: Negative for nausea, abdominal pain, diarrhea, constipation and abdominal distention.  Endocrine: Positive for polydipsia and polyuria.  Musculoskeletal: Negative.   Skin: Negative.   Neurological: Negative.   Psychiatric/Behavioral: Negative.       Objective:   Physical Exam  Constitutional: She is oriented to person, place, and time. She appears well-developed and well-nourished.  HENT:  Head: Normocephalic and atraumatic.  Eyes: EOM are normal.  Neck: Normal range of motion.  Cardiovascular: Normal rate and regular rhythm.   Pulmonary/Chest: Effort normal and breath sounds normal. No respiratory distress. She has no wheezes.  Abdominal: Soft. She exhibits no distension. There is no tenderness. There is no rebound.  Musculoskeletal: She exhibits no edema.  Neurological: She is alert and oriented to person, place, and time. Coordination normal.  Skin: Skin is warm and dry.   Filed  Vitals:   09/16/14 0806  BP: 114/72  Pulse: 96  Temp: 98 F (36.7 C)  TempSrc: Oral  Resp: 12  Height: 5\' 2"  (1.575 m)  Weight: 160 lb (72.576 kg)  SpO2: 96%      Assessment & Plan:

## 2014-09-16 NOTE — Assessment & Plan Note (Addendum)
She is not taking anything right now and has tried OTC zyrtec etc as well as flonase and nasonex. They all worked for small amounts of time and then stopped working. Will try xyzal. No evidence for infection at this time and hx of sinus surgery in 2006.

## 2014-09-16 NOTE — Assessment & Plan Note (Signed)
Up to date on colonoscopy, no weight loss, up to date on mammogram. Checking labs today to make sure we are not missing anemia, kidney or liver dysfunction, thyroid problems, diabetes. Talked to her about regular exercise as she does not exercise now.

## 2014-09-16 NOTE — Progress Notes (Signed)
Pre visit review using our clinic review tool, if applicable. No additional management support is needed unless otherwise documented below in the visit note. 

## 2014-09-17 ENCOUNTER — Other Ambulatory Visit: Payer: Self-pay | Admitting: Internal Medicine

## 2014-09-17 MED ORDER — METFORMIN HCL 500 MG PO TABS: 500.0000 mg | ORAL_TABLET | Freq: Two times a day (BID) | ORAL | Status: DC

## 2014-10-01 ENCOUNTER — Other Ambulatory Visit: Payer: Self-pay | Admitting: Obstetrics and Gynecology

## 2014-10-02 LAB — CYTOLOGY - PAP

## 2015-01-03 ENCOUNTER — Other Ambulatory Visit: Payer: Self-pay | Admitting: Obstetrics and Gynecology

## 2015-06-20 ENCOUNTER — Encounter: Payer: Self-pay | Admitting: Internal Medicine

## 2015-06-20 ENCOUNTER — Ambulatory Visit (INDEPENDENT_AMBULATORY_CARE_PROVIDER_SITE_OTHER): Payer: Managed Care, Other (non HMO) | Admitting: Internal Medicine

## 2015-06-20 ENCOUNTER — Telehealth: Payer: Self-pay

## 2015-06-20 ENCOUNTER — Other Ambulatory Visit (INDEPENDENT_AMBULATORY_CARE_PROVIDER_SITE_OTHER): Payer: Managed Care, Other (non HMO)

## 2015-06-20 VITALS — BP 142/82 | HR 98 | Temp 98.2°F | Resp 12 | Ht 62.0 in | Wt 148.1 lb

## 2015-06-20 DIAGNOSIS — IMO0001 Reserved for inherently not codable concepts without codable children: Secondary | ICD-10-CM

## 2015-06-20 DIAGNOSIS — E1165 Type 2 diabetes mellitus with hyperglycemia: Secondary | ICD-10-CM

## 2015-06-20 DIAGNOSIS — E118 Type 2 diabetes mellitus with unspecified complications: Secondary | ICD-10-CM | POA: Insufficient documentation

## 2015-06-20 DIAGNOSIS — IMO0002 Reserved for concepts with insufficient information to code with codable children: Secondary | ICD-10-CM | POA: Insufficient documentation

## 2015-06-20 LAB — COMPREHENSIVE METABOLIC PANEL
ALBUMIN: 4.6 g/dL (ref 3.5–5.2)
ALT: 11 U/L (ref 0–35)
AST: 10 U/L (ref 0–37)
Alkaline Phosphatase: 184 U/L — ABNORMAL HIGH (ref 39–117)
BUN: 10 mg/dL (ref 6–23)
CHLORIDE: 103 meq/L (ref 96–112)
CO2: 28 mEq/L (ref 19–32)
Calcium: 10.1 mg/dL (ref 8.4–10.5)
Creatinine, Ser: 0.68 mg/dL (ref 0.40–1.20)
GFR: 115.68 mL/min (ref >60.00)
Glucose, Bld: 224 mg/dL — ABNORMAL HIGH (ref 70–99)
POTASSIUM: 4 meq/L (ref 3.5–5.1)
SODIUM: 140 meq/L (ref 135–145)
Total Bilirubin: 0.6 mg/dL (ref 0.2–1.2)
Total Protein: 8.2 g/dL (ref 6.0–8.3)

## 2015-06-20 LAB — HEMOGLOBIN A1C: Hgb A1c MFr Bld: 10.5 % — ABNORMAL HIGH (ref 4.6–6.5)

## 2015-06-20 LAB — LIPID PANEL
CHOLESTEROL: 246 mg/dL — AB (ref 0–200)
HDL: 68.3 mg/dL (ref >39.00)
LDL CALC: 161 mg/dL — AB (ref 0–99)
NonHDL: 177.49
TRIGLYCERIDES: 81 mg/dL (ref 0.0–149.0)
Total CHOL/HDL Ratio: 4
VLDL: 16.2 mg/dL (ref 0.0–40.0)

## 2015-06-20 MED ORDER — HYDROCORTISONE 1 % EX OINT: 1.0000 "application " | TOPICAL_OINTMENT | Freq: Two times a day (BID) | CUTANEOUS | Status: DC

## 2015-06-20 MED ORDER — METFORMIN HCL 500 MG PO TABS: 500.0000 mg | ORAL_TABLET | Freq: Two times a day (BID) | ORAL | Status: DC

## 2015-06-20 MED ORDER — TACROLIMUS 0.1 % EX OINT: TOPICAL_OINTMENT | Freq: Two times a day (BID) | CUTANEOUS | Status: DC

## 2015-06-20 NOTE — Patient Instructions (Addendum)
We would like you to start taking the metformin again. The first week take 1 pill with breakfast, then after try taking 1 pill with breakfast and evening meal.   It is important to work on these sugars to help avoid getting any long term problems from the diabetes.   You will get a call from the diabetes nutritionist to talk to them that your insurance will cover.   Diabetes and Standards of Medical Care Diabetes is complicated. You may find that your diabetes team includes a dietitian, nurse, diabetes educator, eye doctor, and more. To help everyone know what is going on and to help you get the care you deserve, the following schedule of care was developed to help keep you on track. Below are the tests, exams, vaccines, medicines, education, and plans you will need. HbA1c test This test shows how well you have controlled your glucose over the past 2-3 months. It is used to see if your diabetes management plan needs to be adjusted.   It is performed at least 2 times a year if you are meeting treatment goals.  It is performed 4 times a year if therapy has changed or if you are not meeting treatment goals. Blood pressure test  This test is performed at every routine medical visit. The goal is less than 140/90 mm Hg for most people, but 130/80 mm Hg in some cases. Ask your health care provider about your goal. Dental exam  Follow up with the dentist regularly. Eye exam  If you are diagnosed with type 1 diabetes as a child, get an exam upon reaching the age of 14 years or older and having had diabetes for 3-5 years. Yearly eye exams are recommended after that initial eye exam.  If you are diagnosed with type 1 diabetes as an adult, get an exam within 5 years of diagnosis and then yearly.  If you are diagnosed with type 2 diabetes, get an exam as soon as possible after the diagnosis and then yearly. Foot care exam  Visual foot exams are performed at every routine medical visit. The exams  check for cuts, injuries, or other problems with the feet.  You should have a complete foot exam performed every year. This exam includes an inspection of the structure and skin of your feet, a check of the pulses in your feet, and a check of the sensation in your feet.  Type 1 diabetes: The first exam is performed 5 years after diagnosis.  Type 2 diabetes: The first exam is performed at the time of diagnosis.  Check your feet nightly for cuts, injuries, or other problems with your feet. Tell your health care provider if anything is not healing. Kidney function test (urine microalbumin)  This test is performed once a year.  Type 1 diabetes: The first test is performed 5 years after diagnosis.  Type 2 diabetes: The first test is performed at the time of diagnosis.  A serum creatinine and estimated glomerular filtration rate (eGFR) test is done once a year to assess the level of chronic kidney disease (CKD), if present. Lipid profile (cholesterol, HDL, LDL, triglycerides)  Performed every 5 years for most people.  The goal for LDL is less than 100 mg/dL. If you are at high risk, the goal is less than 70 mg/dL.  The goal for HDL is 40 mg/dL-50 mg/dL for men and 50 mg/dL-60 mg/dL for women. An HDL cholesterol of 60 mg/dL or higher gives some protection against heart disease.  The goal for triglycerides is less than 150 mg/dL. Immunizations  The flu (influenza) vaccine is recommended yearly for every person 32 months of age or older who has diabetes.  The pneumonia (pneumococcal) vaccine is recommended for every person 62 years of age or older who has diabetes. Adults 84 years of age or older may receive the pneumonia vaccine as a series of two separate shots.  The hepatitis B vaccine is recommended for adults shortly after they have been diagnosed with diabetes.  The Tdap (tetanus, diphtheria, and pertussis) vaccine should be given:  According to normal childhood vaccination schedules,  for children.  Every 10 years, for adults who have diabetes. Diabetes self-management education  Education is recommended at diagnosis and ongoing as needed. Treatment plan  Your treatment plan is reviewed at every medical visit.   This information is not intended to replace advice given to you by your health care provider. Make sure you discuss any questions you have with your health care provider.   Document Released: 02/14/2009 Document Revised: 05/10/2014 Document Reviewed: 09/19/2012 Elsevier Interactive Patient Education [employer].

## 2015-06-20 NOTE — Telephone Encounter (Signed)
PA initiated and approved via CoverMyMeds Key [id number] thru 04/18/2017

## 2015-06-20 NOTE — Progress Notes (Signed)
Pre visit review using our clinic review tool, if applicable. No additional management support is needed unless otherwise documented below in the visit note. 

## 2015-06-22 NOTE — Progress Notes (Signed)
   Subjective:    Patient ID: Nicole Gallagher, female    DOB: 03/13/1961, 55 y.o.   MRN: [id number]  HPI The patient is a 55 YO female coming in for her diabetes which is not controlled. She was asked to follow up about 9 months ago but did not do that. She took metformin for about 1 month without side effects after it was prescribed but did not continue. She is not aware about some of the serious side effects of diabetes on the body. She does not know what she should be eating. Not exercising.   Review of Systems  Constitutional: Negative for fever, chills, activity change, appetite change, fatigue and unexpected weight change.  HENT: Negative for congestion, nosebleeds, postnasal drip, rhinorrhea, sinus pressure, sore throat and trouble swallowing.   Eyes: Negative.   Respiratory: Negative for cough, chest tightness, shortness of breath and wheezing.   Cardiovascular: Negative for chest pain, palpitations and leg swelling.  Gastrointestinal: Negative for nausea, abdominal pain, diarrhea, constipation and abdominal distention.  Endocrine: Positive for polydipsia and polyuria.  Musculoskeletal: Negative.   Skin: Negative.   Neurological: Negative.   Psychiatric/Behavioral: Negative.       Objective:   Physical Exam  Constitutional: She is oriented to person, place, and time. She appears well-developed and well-nourished.  HENT:  Head: Normocephalic and atraumatic.  Eyes: EOM are normal.  Neck: Normal range of motion.  Cardiovascular: Normal rate and regular rhythm.   Pulmonary/Chest: Effort normal and breath sounds normal. No respiratory distress. She has no wheezes.  Abdominal: Soft. She exhibits no distension. There is no tenderness. There is no rebound.  Musculoskeletal: She exhibits no edema.  Neurological: She is alert and oriented to person, place, and time. Coordination normal.  Skin: Skin is warm and dry.   Filed Vitals:   06/20/15 1030  BP: 142/82  Pulse: 98  Temp: 98.2  F (36.8 C)  TempSrc: Oral  Resp: 12  Height:  (1.575 m)  Weight: 148 lb 1.9 oz (67.187 kg)  SpO2: 98%      Assessment & Plan:

## 2015-06-22 NOTE — Assessment & Plan Note (Signed)
Spent a significant amount of time on counseling on the disease process. Referral to medical nutrition for initial diabetes education. Rx for metformin and she will resume. Foot exam done and counseling on need for yearly eye exam. Discussed sequelae of uncontrolled diabetes. Needs follow up in 3 months and talked to her about the importance of regular follow up until her sugars are controlled.

## 2015-07-15 ENCOUNTER — Encounter: Payer: Managed Care, Other (non HMO) | Attending: Internal Medicine

## 2015-07-15 VITALS — Ht 62.0 in | Wt 152.7 lb

## 2015-07-15 DIAGNOSIS — E1165 Type 2 diabetes mellitus with hyperglycemia: Secondary | ICD-10-CM | POA: Diagnosis present

## 2015-07-15 DIAGNOSIS — E119 Type 2 diabetes mellitus without complications: Secondary | ICD-10-CM

## 2015-07-21 NOTE — Progress Notes (Signed)
Patient was seen on 07-15-15 for the first of a series of three diabetes self-management courses at the [facility].  Patient Education Plan per assessed needs and concerns is to attend four course education program for Diabetes Self Management Education.  The following learning objectives were met by the patient during this class:  Describe diabetes  State some common risk factors for diabetes  Defines the role of glucose and insulin  Identifies type of diabetes and pathophysiology  Describe the relationship between diabetes and cardiovascular risk  State the members of the Healthcare Team  States the rationale for glucose monitoring  State when to test glucose  State their individual Target Range  State the importance of logging glucose readings  Describe how to interpret glucose readings  Identifies A1C target  Explain the correlation between A1c and eAG values  State symptoms and treatment of high blood glucose  State symptoms and treatment of low blood glucose  Explain proper technique for glucose testing  Identifies proper sharps disposal  Handouts given during class include:  Living Well with Diabetes book  Carb Counting and Meal Planning book  Meal Plan Card  Carbohydrate guide  Meal planning worksheet  Low Sodium Flavoring Tips  The diabetes portion plate  A1c to eAG Conversion Chart  Diabetes Medications  Diabetes Recommended Care Schedule  Support Group  Diabetes Success Plan  Core Class Satisfaction Survey  Follow-Up Plan:  Attend core 2    

## 2015-07-22 DIAGNOSIS — E1165 Type 2 diabetes mellitus with hyperglycemia: Secondary | ICD-10-CM | POA: Diagnosis not present

## 2015-07-22 DIAGNOSIS — E119 Type 2 diabetes mellitus without complications: Secondary | ICD-10-CM

## 2015-07-22 NOTE — Progress Notes (Signed)

## 2015-07-29 DIAGNOSIS — E1165 Type 2 diabetes mellitus with hyperglycemia: Secondary | ICD-10-CM | POA: Diagnosis not present

## 2015-07-29 DIAGNOSIS — E119 Type 2 diabetes mellitus without complications: Secondary | ICD-10-CM

## 2015-08-05 NOTE — Progress Notes (Signed)
Patient was seen on 07/31/15 for the third of a series of three diabetes self-management courses at the [facility].   . State the amount of activity recommended for healthy living . Describe activities suitable for individual needs . Identify ways to regularly incorporate activity into daily life . Identify barriers to activity and ways to over come these barriers  Identify diabetes medications being personally used and their primary action for lowering glucose and possible side effects . Describe role of stress on blood glucose and develop strategies to address psychosocial issues . Identify diabetes complications and ways to prevent them  Explain how to manage diabetes during illness . Evaluate success in meeting personal goal . Establish 2-3 goals that they will plan to diligently work on until they return for the  4-month follow-up visit  Goals:   Not completed by patient  Your patient has identified these potential barriers to change:  Not completed by patient  Your patient has identified their diabetes self-care support plan as  Not completed by patient Plan:  Attend Support Group as desired   

## 2015-09-17 ENCOUNTER — Ambulatory Visit: Payer: Managed Care, Other (non HMO) | Admitting: Internal Medicine

## 2015-12-18 ENCOUNTER — Ambulatory Visit (INDEPENDENT_AMBULATORY_CARE_PROVIDER_SITE_OTHER): Payer: Managed Care, Other (non HMO) | Admitting: Internal Medicine

## 2015-12-18 ENCOUNTER — Encounter: Payer: Self-pay | Admitting: Internal Medicine

## 2015-12-18 ENCOUNTER — Other Ambulatory Visit (INDEPENDENT_AMBULATORY_CARE_PROVIDER_SITE_OTHER): Payer: Managed Care, Other (non HMO)

## 2015-12-18 VITALS — BP 132/82 | HR 107 | Temp 98.3°F | Resp 16 | Ht 62.0 in | Wt 157.8 lb

## 2015-12-18 DIAGNOSIS — IMO0001 Reserved for inherently not codable concepts without codable children: Secondary | ICD-10-CM

## 2015-12-18 DIAGNOSIS — E1165 Type 2 diabetes mellitus with hyperglycemia: Secondary | ICD-10-CM

## 2015-12-18 LAB — MICROALBUMIN / CREATININE URINE RATIO
CREATININE, U: 119.7 mg/dL
MICROALB UR: 1 mg/dL (ref 0.0–1.9)
MICROALB/CREAT RATIO: 0.8 mg/g (ref 0.0–30.0)

## 2015-12-18 LAB — MAGNESIUM: Magnesium: 2 mg/dL (ref 1.5–2.5)

## 2015-12-18 LAB — COMPREHENSIVE METABOLIC PANEL
ALK PHOS: 164 U/L — AB (ref 39–117)
ALT: 13 U/L (ref 0–35)
AST: 12 U/L (ref 0–37)
Albumin: 4.5 g/dL (ref 3.5–5.2)
BUN: 14 mg/dL (ref 6–23)
CALCIUM: 9.9 mg/dL (ref 8.4–10.5)
CO2: 29 meq/L (ref 19–32)
Chloride: 101 mEq/L (ref 96–112)
Creatinine, Ser: 0.74 mg/dL (ref 0.40–1.20)
GFR: 104.73 mL/min (ref >60.00)
GLUCOSE: 263 mg/dL — AB (ref 70–99)
POTASSIUM: 4.4 meq/L (ref 3.5–5.1)
Sodium: 137 mEq/L (ref 135–145)
TOTAL PROTEIN: 7.9 g/dL (ref 6.0–8.3)
Total Bilirubin: 0.6 mg/dL (ref 0.2–1.2)

## 2015-12-18 LAB — HEMOGLOBIN A1C: HEMOGLOBIN A1C: 10.4 % — AB (ref 4.6–6.5)

## 2015-12-18 NOTE — Patient Instructions (Signed)
We will check the blood and urine for the sugars today to see how they are doing.   It is definitely important to start working on you and the sugars to help prevent long term problems from the diabetes like kidney problems or blood flow problems to the feet.   Depending on the labs we may need to adjust the medicine for the sugars.   Diabetes and Standards of Medical Care Diabetes is complicated. You may find that your diabetes team includes a dietitian, nurse, diabetes educator, eye doctor, and more. To help everyone know what is going on and to help you get the care you deserve, the following schedule of care was developed to help keep you on track. Below are the tests, exams, vaccines, medicines, education, and plans you will need. HbA1c test This test shows how well you have controlled your glucose over the past 2-3 months. It is used to see if your diabetes management plan needs to be adjusted.   It is performed at least 2 times a year if you are meeting treatment goals.  It is performed 4 times a year if therapy has changed or if you are not meeting treatment goals. Blood pressure test  This test is performed at every routine medical visit. The goal is less than 140/90 mm Hg for most people, but 130/80 mm Hg in some cases. Ask your health care provider about your goal. Dental exam  Follow up with the dentist regularly. Eye exam  If you are diagnosed with type 1 diabetes as a child, get an exam upon reaching the age of 49 years or older and having had diabetes for 3-5 years. Yearly eye exams are recommended after that initial eye exam.  If you are diagnosed with type 1 diabetes as an adult, get an exam within 5 years of diagnosis and then yearly.  If you are diagnosed with type 2 diabetes, get an exam as soon as possible after the diagnosis and then yearly. Foot care exam  Visual foot exams are performed at every routine medical visit. The exams check for cuts, injuries, or other  problems with the feet.  You should have a complete foot exam performed every year. This exam includes an inspection of the structure and skin of your feet, a check of the pulses in your feet, and a check of the sensation in your feet.  Type 1 diabetes: The first exam is performed 5 years after diagnosis.  Type 2 diabetes: The first exam is performed at the time of diagnosis.  Check your feet nightly for cuts, injuries, or other problems with your feet. Tell your health care provider if anything is not healing. Kidney function test (urine microalbumin)  This test is performed once a year.  Type 1 diabetes: The first test is performed 5 years after diagnosis.  Type 2 diabetes: The first test is performed at the time of diagnosis.  A serum creatinine and estimated glomerular filtration rate (eGFR) test is done once a year to assess the level of chronic kidney disease (CKD), if present. Lipid profile (cholesterol, HDL, LDL, triglycerides)  Performed every 5 years for most people.  The goal for LDL is less than 100 mg/dL. If you are at high risk, the goal is less than 70 mg/dL.  The goal for HDL is 40 mg/dL-50 mg/dL for men and 50 mg/dL-60 mg/dL for women. An HDL cholesterol of 60 mg/dL or higher gives some protection against heart disease.  The goal for triglycerides  is less than 150 mg/dL. Immunizations  The flu (influenza) vaccine is recommended yearly for every person 32 months of age or older who has diabetes.  The pneumonia (pneumococcal) vaccine is recommended for every person 24 years of age or older who has diabetes. Adults 51 years of age or older may receive the pneumonia vaccine as a series of two separate shots.  The hepatitis B vaccine is recommended for adults shortly after they have been diagnosed with diabetes.  The Tdap (tetanus, diphtheria, and pertussis) vaccine should be given:  According to normal childhood vaccination schedules, for children.  Every 10 years,  for adults who have diabetes. Diabetes self-management education  Education is recommended at diagnosis and ongoing as needed. Treatment plan  Your treatment plan is reviewed at every medical visit.   This information is not intended to replace advice given to you by your health care provider. Make sure you discuss any questions you have with your health care provider.   Document Released: 02/14/2009 Document Revised: 05/10/2014 Document Reviewed: 09/19/2012 Elsevier Interactive Patient Education [employer].

## 2015-12-18 NOTE — Assessment & Plan Note (Signed)
She is late for her follow up and she is now taking her metformin 500 mg BID. She will likely need increased therapy. She is reminded about the serious side effects and complications of uncontrolled diabetes. Checking HgA1c and microalbumin to creatinine ratio for complications.

## 2015-12-18 NOTE — Progress Notes (Signed)
Pre visit review using our clinic review tool, if applicable. No additional management support is needed unless otherwise documented below in the visit note. 

## 2015-12-18 NOTE — Progress Notes (Signed)
   Subjective:    Patient ID: Nicole Gallagher, female    DOB: June 08, 1960, 55 y.o.   MRN: [id number]  HPI The patient is a 55 YO female coming in for follow up of her diabetes. 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 She is planning to start exercise. She is up about 10 pounds since last visit. Denies new numbness or pain in her feet. No chest pains, SOB, abdominal pain, nausea.   Review of Systems  Constitutional: Negative for activity change, appetite change, chills, fatigue, fever and unexpected weight change.  HENT: Negative for congestion, nosebleeds, postnasal drip, rhinorrhea, sinus pressure, sore throat and trouble swallowing.   Eyes: Negative.   Respiratory: Negative for cough, chest tightness, shortness of breath and wheezing.   Cardiovascular: Negative for chest pain, palpitations and leg swelling.  Gastrointestinal: Negative for abdominal distention, abdominal pain, constipation, diarrhea and nausea.  Musculoskeletal: Negative.   Skin: Negative.   Neurological: Negative.       Objective:   Physical Exam  Constitutional: She is oriented to person, place, and time. She appears well-developed and well-nourished.  HENT:  Head: Normocephalic and atraumatic.  Eyes: EOM are normal.  Neck: Normal range of motion.  Cardiovascular: Normal rate and regular rhythm.   Pulmonary/Chest: Effort normal and breath sounds normal. No respiratory distress. She has no wheezes.  Abdominal: Soft. She exhibits no distension. There is no tenderness. There is no rebound.  Musculoskeletal: She exhibits no edema.  Neurological: She is alert and oriented to person, place, and time. Coordination normal.  Skin: Skin is warm and dry.   Vitals:   12/18/15 1109  BP: 132/82  Pulse: (!) 107  Resp: 16  Temp: 98.3 F (36.8 C)    TempSrc: Oral  SpO2: 98%  Weight: 157 lb 12.8 oz (71.6 kg)  Height: 5\' 2"  (1.575 m)      Assessment & Plan:

## 2015-12-19 ENCOUNTER — Other Ambulatory Visit: Payer: Self-pay | Admitting: Internal Medicine

## 2015-12-19 DIAGNOSIS — E1165 Type 2 diabetes mellitus with hyperglycemia: Principal | ICD-10-CM

## 2015-12-19 DIAGNOSIS — IMO0001 Reserved for inherently not codable concepts without codable children: Secondary | ICD-10-CM

## 2015-12-19 MED ORDER — TRIAMCINOLONE ACETONIDE 0.1 % EX CREA: 1.0000 "application " | TOPICAL_CREAM | Freq: Two times a day (BID) | CUTANEOUS | 0 refills | Status: DC

## 2016-01-02 ENCOUNTER — Encounter: Payer: Self-pay | Admitting: Internal Medicine

## 2016-01-02 ENCOUNTER — Ambulatory Visit (INDEPENDENT_AMBULATORY_CARE_PROVIDER_SITE_OTHER): Payer: Managed Care, Other (non HMO) | Admitting: Internal Medicine

## 2016-01-02 VITALS — BP 132/86 | HR 119 | Temp 97.9°F | Resp 12 | Ht 62.0 in | Wt 156.0 lb

## 2016-01-02 DIAGNOSIS — M25522 Pain in left elbow: Secondary | ICD-10-CM | POA: Insufficient documentation

## 2016-01-02 MED ORDER — DICLOFENAC SODIUM 75 MG PO TBEC: 75.0000 mg | DELAYED_RELEASE_TABLET | Freq: Two times a day (BID) | ORAL | 0 refills | Status: DC

## 2016-01-02 NOTE — Progress Notes (Signed)
Pre visit review using our clinic review tool, if applicable. No additional management support is needed unless otherwise documented below in the visit note. 

## 2016-01-02 NOTE — Progress Notes (Signed)
   Subjective:    Patient ID: Nicole Gallagher, female    DOB: 21-Nov-1960, 55 y.o.   MRN: [id number]  HPI The patient is a 55 YO female coming in for left elbow pain. Hit the elbow against a wall at work on accident about 2 months ago. It hurt after that and has never really gone away. No bruise over the area or swelling after the injury. Since that time she has noticed some decreased strength due to pain in the hand with gripping objects. She thought it would decrease but she is still having it. Using ibuprofen which helps but does not last long.   Review of Systems  Constitutional: Positive for activity change. Negative for appetite change, fatigue, fever and unexpected weight change.  Respiratory: Negative.   Cardiovascular: Negative.   Gastrointestinal: Negative.   Musculoskeletal: Positive for arthralgias and myalgias. Negative for back pain, gait problem, joint swelling, neck pain and neck stiffness.  Skin: Negative.       Objective:   Physical Exam  Constitutional: She is oriented to person, place, and time. She appears well-developed and well-nourished.  HENT:  Head: Normocephalic and atraumatic.  Cardiovascular: Normal rate and regular rhythm.   Pulmonary/Chest: Effort normal and breath sounds normal.  Abdominal: Soft.  Musculoskeletal:  Some decreased strength in the left hand but unclear if related to pain or decreased function. Shoulder with intact ROM and strength. Pain over the elbow posteriorly without swelling or skin change.   Neurological: She is alert and oriented to person, place, and time.  Skin: Skin is warm and dry.   Vitals:   01/02/16 0849  BP: 132/86  Pulse: (!) 119  Resp: 12  Temp: 97.9 F (36.6 C)  TempSrc: Oral  SpO2: 98%  Weight: 156 lb (70.8 kg)  Height: 5\' 2"  (1.575 m)      Assessment & Plan:

## 2016-01-02 NOTE — Patient Instructions (Signed)
We have sent in voltaren pills. Take 1 pill twice a day for the next 1-2 weeks.   If you are not feeling better see Dr. Katrinka BlazingSmith of sports medicine at our office (you will schedule this visit today). If you are feeling 100% better than cancel the appointment if you don't need it.    Elbow Bursitis Elbow bursitis is inflammation of the fluid-filled sac (bursa) between the tip of your elbow bone (olecranon) and your skin. Elbow bursitis may also be called olecranon bursitis. Normally, the olecranon bursa has only a small amount of fluid in it to cushion and protect your elbow bone. Elbow bursitis causes fluid to build up inside the bursa. Over time, this swelling and inflammation can cause pain when you bend or lean on your elbow.  CAUSES Elbow bursitis may be caused by:   Elbow injury (acute trauma).  Leaning on hard surfaces for long periods of time.  Infection from an injury that breaks the skin near your elbow.  A bone growth (spur) that forms at the tip of your elbow.  A medical condition that causes inflammation in your body, such as gout or rheumatoid arthritis.  The cause may also be unknown.  SIGNS AND SYMPTOMS  The first sign of elbow bursitis is usually swelling over the tip of your elbow. This can grow to be the size of a golf ball. This may start suddenly or develop gradually. You may also have:  Pain when bending or leaning on your elbow.  Restricted movement of your elbow.  If your bursitis is caused by an infection, symptoms may also include:  Redness, warmth, and tenderness of the elbow.  Drainage of pus from the swollen area over your elbow, if the skin breaks open. DIAGNOSIS  Your health care provider may be able to diagnose elbow bursitis based on your signs and symptoms, especially if you have recently been injured. Your health care provider will also do a physical exam. This may include:  X-rays to look for a bone spur or a bone fracture.  Draining fluid from  the bursa to test it for infection.  Blood tests to rule out gout or rheumatoid arthritis. TREATMENT  Treatment for elbow bursitis depends on the cause. Treatment may include:  Medicines. These may include:  Over-the-counter medicines to relieve pain and inflammation.  Antibiotic medicines to fight infection.  Injections of anti-inflammatory medicines (steroids).  Wrapping your elbow with a bandage.  Draining fluid from the bursa.  Wearing elbow pads.  If your bursitis does not get better with treatment, surgery may be needed to remove the bursa.  HOME CARE INSTRUCTIONS   Take medicines only as directed by your health care provider.  If you were prescribed an antibiotic medicine, finish all of it even if you start to feel better.  If your bursitis is caused by an injury, rest your elbow and wear your bandage as directed by your health care provider. You may alsoapply ice to the injured area as directed by your health care provider:  Put ice in a plastic bag.  Place a towel between your skin and the bag.  Leave the ice on for 20 minutes, 2-3 times per day.  Avoid any activities that cause elbow pain.  Use elbow pads or elbow wraps to cushion your elbow. SEEK MEDICAL CARE IF:  You have a fever.   Your symptoms do not get better with treatment.  Your pain or swelling gets worse.  Your elbow pain or swelling  goes away and then returns.  You have drainage of pus from the swollen area over your elbow.   This information is not intended to replace advice given to you by your health care provider. Make sure you discuss any questions you have with your health care provider.   Document Released: 05/19/2006 Document Revised: 05/10/2014 Document Reviewed: 12/26/2013 Elsevier Interactive Patient Education [employer]

## 2016-01-02 NOTE — Assessment & Plan Note (Addendum)
Rx for voltaren to help with inflammation. Referral to sports medicine to get evaluation if this fails (within 2 weeks given decreased function).

## 2016-01-06 ENCOUNTER — Encounter: Payer: Self-pay | Admitting: Endocrinology

## 2016-01-12 ENCOUNTER — Encounter: Payer: Self-pay | Admitting: Internal Medicine

## 2016-01-25 ENCOUNTER — Other Ambulatory Visit: Payer: Self-pay | Admitting: Internal Medicine

## 2016-01-25 NOTE — Progress Notes (Signed)
Nicole ScaleZach Gallagher D.O. [facility] [address] [place], [place] [address] Phone: [phone] Subjective:    I'm seeing this patient by the request  of:  Nicole BrokerElizabeth A Crawford, MD   CC: Left elbow pain  XLK:[id number]:Subjective  Nicole JoyLisa Gallagher is a 55 y.o. female coming in with complaint of left elbow pain. Patient states that this initial incident that happened after she hit the elbow against the wall. That is approximately 3 months ago. States that the pain is never completely resolved. Does not remember any bruising or any swelling. Has noticed some mild decreased strength especially with grip. Maybe some mild radiation down the hand. Denies any numbness though. States that certain movements can cause a severe pain. Has been using over-the-counter anti-inflammatories can help for short time. Never completely resolves.        Past Medical History:  Diagnosis Date  . Allergy   . Diabetes mellitus without complication [facility](HCC)    Past Surgical History:  Procedure Laterality Date  . ABDOMINAL HYSTERECTOMY  11/2006  . NASAL SINUS SURGERY     Social History   Social History  . Marital status: Married    Spouse name: N/A  . Number of children: N/A  . Years of education: N/A   Social History Main Topics  . Smoking status: Never Smoker  . Smokeless tobacco: Not on file  . Alcohol use No  . Drug use: No  . Sexual activity: Not on file   Other Topics Concern  . Not on file   Social History Narrative  . No narrative on file   No Known Allergies Family History  Problem Relation Age of Onset  . Cancer Mother     cervical  . Hypertension Father   . Hyperlipidemia Father   . Hypertension Maternal Aunt   . Diabetes Maternal Aunt   . Hypertension Maternal Uncle   . Diabetes Maternal Uncle   . Hypertension Paternal Aunt   . Diabetes Paternal Aunt   . Hypertension Paternal Uncle   . Diabetes Paternal Uncle   . Hypertension Maternal Grandmother   . Diabetes Maternal  Grandmother   . Hypertension Maternal Grandfather   . Diabetes Maternal Grandfather   . Hypertension Paternal Grandmother   . Diabetes Paternal Grandmother   . Hypertension Paternal Grandfather   . Diabetes Paternal Grandfather     Past medical history, social, surgical and family history all reviewed in electronic medical record.  No pertanent information unless stated regarding to the chief complaint.   Review of Systems: No headache, visual changes, nausea, vomiting, diarrhea, constipation, dizziness, abdominal pain, skin rash, fevers, chills, night sweats, weight loss, swollen lymph nodes, body aches, joint swelling, muscle aches, chest pain, shortness of breath, mood changes.   Objective  Blood pressure 124/80, pulse 96, weight 159 lb 6.4 oz (72.3 kg).  General: No apparent distress alert and oriented x3 mood and affect normal, dressed appropriately.  HEENT: Pupils equal, extraocular movements intact  Respiratory: Patient's speak in full sentences and does not appear short of breath  Cardiovascular: No lower extremity edema, non tender, no erythema  Skin: Warm dry intact with no signs of infection or rash on extremities or on axial skeleton.  Abdomen: Soft nontender  Neuro: Cranial nerves II through XII are intact, neurovascularly intact in all extremities with 2+ DTRs and 2+ pulses.  Lymph: No lymphadenopathy of posterior or anterior cervical chain or axillae bilaterally.  Gait normal with good balance and coordination.  MSK:  Non tender  with full range of motion and good stability and symmetric strength and tone of shoulders, , wrist, hip, knee and ankles bilaterally.  Neck: Inspection unremarkable. No palpable stepoffs. Negative Spurling's maneuver. Full neck range of motion Grip strength and sensation normal in bilateral hands Strength good C4 to T1 distribution No sensory change to C4 to T1 Negative Hoffman sign bilaterally Reflexes normal Elbow: Left Unremarkable to  inspection. Range of motion full pronation, supination, flexion, extension.Patient though is uncomfortable in any of the extreme positions. Strength is full to all of the above directions patient states though that strength testing is uncomfortable. Stable to varus, valgus stress. Negative moving valgus stress test. Diffusely uncomfortable to palpation but nothing specific. Ulnar nerve does not sublux. Negative cubital tunnel Tinel's. Contralateral elbow unremarkable  Musculoskeletal ultrasound was performed and interpreted by Terrilee Files D.O.   Elbow: Left Lateral epicondyle and common extensor tendon origin visualized.  No edema, effusions, or avulsions seen.  Radial head has some increasing Doppler flow but no true cortical defect. Mild hypoechoic changes surrounding the area. Medial epicondyle and common flexor tendon origin visualized.  No edema, effusions, or avulsions seen. Ulnar nerve in cubital tunnel unremarkable. Olecranon and triceps insertion visualized and unremarkable without edema, effusion, or avulsion.  No signs olecranon bursitis. Power doppler signal normal.  IMPRESSION: Questionable radial head injury    Impression and Recommendations:     This case required medical decision making of moderate complexity.      Note: This dictation was prepared with Dragon dictation along with smaller phrase technology. Any transcriptional errors that result from this process are unintentional.

## 2016-01-26 ENCOUNTER — Other Ambulatory Visit: Payer: Self-pay

## 2016-01-26 ENCOUNTER — Ambulatory Visit (INDEPENDENT_AMBULATORY_CARE_PROVIDER_SITE_OTHER): Payer: Managed Care, Other (non HMO) | Admitting: Family Medicine

## 2016-01-26 ENCOUNTER — Encounter: Payer: Self-pay | Admitting: Family Medicine

## 2016-01-26 ENCOUNTER — Ambulatory Visit (INDEPENDENT_AMBULATORY_CARE_PROVIDER_SITE_OTHER)
Admission: RE | Admit: 2016-01-26 | Discharge: 2016-01-26 | Disposition: A | Discharge status: Home / Self Care | Payer: Managed Care, Other (non HMO) | Source: Ambulatory Visit | Attending: Family Medicine | Admitting: Family Medicine

## 2016-01-26 VITALS — BP 124/80 | HR 96 | Wt 159.4 lb

## 2016-01-26 DIAGNOSIS — M25522 Pain in left elbow: Secondary | ICD-10-CM | POA: Diagnosis not present

## 2016-01-26 MED ORDER — VITAMIN D (ERGOCALCIFEROL) 1.25 MG (50000 UNIT) PO CAPS: 50000.0000 [IU] | ORAL_CAPSULE | ORAL | 0 refills | Status: DC

## 2016-01-26 MED ORDER — DICLOFENAC SODIUM 2 % TD SOLN
2.0000 | Freq: Two times a day (BID) | TRANSDERMAL | 3 refills | Status: DC
Start: 2016-01-26 — End: 2016-09-01

## 2016-01-26 NOTE — Patient Instructions (Signed)
Good to see you.  Xray downstairs.  Ice 20 minutes 2 times daily. Usually after activity and before bed. Exercises 3 times a week.  Ice is your friend Ice 20 minutes 2 times daily. Usually after activity and before bed. pennsaid pinkie amount topically 2 times daily as needed.  See me again in 3-4 weeks.

## 2016-01-26 NOTE — Assessment & Plan Note (Addendum)
Patient's abdominal pain since a more like a small difficulty with a radial head fracture. X-rays are pending. We discussed icing regimen. We discussed home exercises. We discussed objective is doing which ones to avoid. Patient work with Event organiserathletic trainer to exercises in greater detail. Patient will do a once weekly vitamin D, given topical anti-inflammatories. We discussed compression sleeve if any inflammation is contribute in. Patient will come back and see me again in 3 weeks for further evaluation. If continuing have pain we'll consider formal physical therapy.

## 2016-02-10 ENCOUNTER — Ambulatory Visit: Payer: Managed Care, Other (non HMO) | Admitting: Endocrinology

## 2016-02-15 NOTE — Progress Notes (Deleted)
Nicole Gallagher [address] [place], [place] [address] Phone: [phone] Subjective:    I'm seeing this patient by the request  of:  Nicole Broker, MD   CC: Left elbow pain Follow  [id number]  Nicole Gallagher is a 55 y.o. female coming in with complaint of left elbow pain. Patient was having left elbow pain for 3 months after a potential incident against wall. Patient hand a stable change of the radial head on ultrasound as well as possible olecronon bursitis    X-rays taken to cover 25th 2017 were independently visualized by me. Patient was found to have a very small olecranon spur and likely olecranon bursitis.    Past Medical History:  Diagnosis Date  . Allergy   . Diabetes mellitus without complication [facility])    Past Surgical History:  Procedure Laterality Date  . ABDOMINAL HYSTERECTOMY  11/2006  . NASAL SINUS SURGERY     Social History   Social History  . Marital status: Married    Spouse name: N/A  . Number of children: N/A  . Years of education: N/A   Social History Main Topics  . Smoking status: Never Smoker  . Smokeless tobacco: Not on file  . Alcohol use No  . Drug use: No  . Sexual activity: Not on file   Other Topics Concern  . Not on file   Social History Narrative  . No narrative on file   No Known Allergies Family History  Problem Relation Age of Onset  . Cancer Mother     cervical  . Hypertension Father   . Hyperlipidemia Father   . Hypertension Maternal Aunt   . Diabetes Maternal Aunt   . Hypertension Maternal Uncle   . Diabetes Maternal Uncle   . Hypertension Paternal Aunt   . Diabetes Paternal Aunt   . Hypertension Paternal Uncle   . Diabetes Paternal Uncle   . Hypertension Maternal Grandmother   . Diabetes Maternal Grandmother   . Hypertension Maternal Grandfather   . Diabetes Maternal Grandfather   . Hypertension Paternal Grandmother   . Diabetes Paternal Grandmother   . Hypertension  Paternal Grandfather   . Diabetes Paternal Grandfather     Past medical history, social, surgical and family history all reviewed in electronic medical record.  No pertanent information unless stated regarding to the chief complaint.   Review of Systems: No headache, visual changes, nausea, vomiting, diarrhea, constipation, dizziness, abdominal pain, skin rash, fevers, chills, night sweats, weight loss, swollen lymph nodes, body aches, joint swelling, muscle aches, chest pain, shortness of breath, mood changes.   Objective  There were no vitals taken for this visit.  General: No apparent distress alert and oriented x3 mood and affect normal, dressed appropriately.  HEENT: Pupils equal, extraocular movements intact  Respiratory: Patient's speak in full sentences and does not appear short of breath  Cardiovascular: No lower extremity edema, non tender, no erythema  Skin: Warm dry intact with no signs of infection or rash on extremities or on axial skeleton.  Abdomen: Soft nontender  Neuro: Cranial nerves II through XII are intact, neurovascularly intact in all extremities with 2+ DTRs and 2+ pulses.  Lymph: No lymphadenopathy of posterior or anterior cervical chain or axillae bilaterally.  Gait normal with good balance and coordination.  MSK:  Non tender with full range of motion and good stability and symmetric strength and tone of shoulders, , wrist, hip, knee and ankles bilaterally.  Neck: Inspection unremarkable. No  palpable stepoffs. Negative Spurling's maneuver. Full neck range of motion Grip strength and sensation normal in bilateral hands Strength good C4 to T1 distribution No sensory change to C4 to T1 Negative Hoffman sign bilaterally Reflexes normal Elbow: Left Unremarkable to inspection. Range of motion full pronation, supination, flexion, extension.Patient though is uncomfortable in any of the extreme positions. Strength is full to all of the above directions patient  states though that strength testing is uncomfortable. Stable to varus, valgus stress. Negative moving valgus stress test. Diffusely uncomfortable to palpation but nothing specific. Ulnar nerve does not sublux. Negative cubital tunnel Tinel's. Contralateral elbow unremarkable  Musculoskeletal ultrasound was performed and interpreted by Nicole Gallagher D.O.   Elbow: Left Lateral epicondyle and common extensor tendon origin visualized.  No edema, effusions, or avulsions seen.  Radial head has some increasing Doppler flow but no true cortical defect. Mild hypoechoic changes surrounding the area. Medial epicondyle and common flexor tendon origin visualized.  No edema, effusions, or avulsions seen. Ulnar nerve in cubital tunnel unremarkable. Olecranon and triceps insertion visualized and unremarkable without edema, effusion, or avulsion.  No signs olecranon bursitis. Power doppler signal normal.  IMPRESSION: Questionable radial head injury    Impression and Recommendations:     This case required medical decision making of moderate complexity.      Note: This dictation was prepared with Dragon dictation along with smaller phrase technology. Any transcriptional errors that result from this process are unintentional.

## 2016-02-16 ENCOUNTER — Ambulatory Visit: Payer: Managed Care, Other (non HMO) | Admitting: Family Medicine

## 2016-02-24 ENCOUNTER — Ambulatory Visit: Payer: Managed Care, Other (non HMO) | Admitting: Endocrinology

## 2016-03-04 ENCOUNTER — Ambulatory Visit (INDEPENDENT_AMBULATORY_CARE_PROVIDER_SITE_OTHER): Payer: Managed Care, Other (non HMO) | Admitting: Endocrinology

## 2016-03-04 ENCOUNTER — Encounter: Payer: Self-pay | Admitting: Endocrinology

## 2016-03-04 VITALS — BP 122/82 | HR 104 | Ht 62.0 in | Wt 155.0 lb

## 2016-03-04 DIAGNOSIS — E1165 Type 2 diabetes mellitus with hyperglycemia: Secondary | ICD-10-CM

## 2016-03-04 DIAGNOSIS — IMO0001 Reserved for inherently not codable concepts without codable children: Secondary | ICD-10-CM

## 2016-03-04 LAB — POCT GLYCOSYLATED HEMOGLOBIN (HGB A1C): Hemoglobin A1C: 10.3

## 2016-03-04 MED ORDER — EMPAGLIFLOZIN 10 MG PO TABS: 10.0000 mg | ORAL_TABLET | Freq: Every day | ORAL | 11 refills | Status: DC

## 2016-03-04 MED ORDER — LINAGLIPTIN 5 MG PO TABS: 5.0000 mg | ORAL_TABLET | Freq: Every day | ORAL | 11 refills | Status: DC

## 2016-03-04 MED ORDER — GLUCOSE BLOOD VI STRP: 1.0000 | ORAL_STRIP | Freq: Every day | 3 refills | Status: DC

## 2016-03-04 NOTE — Patient Instructions (Addendum)
good diet and exercise significantly improve the control of your diabetes.  please let me know if you wish to be referred to a dietician.  high blood sugar is very risky to your health.  you should see an eye doctor and dentist every year.  It is very important to get all recommended vaccinations.  Controlling your blood pressure and cholesterol drastically reduces the damage diabetes does to your body.  Those who smoke should quit.  Please discuss these with your doctor.  check your blood sugar once a day.  vary the time of day when you check, between before the 3 meals, and at bedtime.  also check if you have symptoms of your blood sugar being too high or too low.  please keep a record of the readings and bring it to your next appointment here (or you can bring the meter itself).  You can write it on any piece of paper.  please call us sooner if your blood sugar goes below 70, or if you have a lot of readings over 200. Here is a new meter.  I have sent a prescription to your pharmacy, for strips. I have sent a prescription to your pharmacy, to add "tradjenta," and "jardiance."  Call if it is high, so we can add "bromocriptine."  Please come back for a follow-up appointment in 1 month.

## 2016-03-04 NOTE — Progress Notes (Signed)
Subjective:    Patient ID: Nicole Gallagher, female    DOB: [birth year], 55 y.o.   MRN: [id number]  HPI pt states DM was dx'ed in 2013; she has mild if any neuropathy of the lower extremities; she is unaware of any associated chronic complications; she has never been on insulin; pt says her diet and exercise are good; she has never had GDM (G0), pancreatitis, severe hypoglycemia or DKA.  She takes metformin since dx.   Past Medical History:  Diagnosis Date  . Allergy   . Diabetes mellitus without complication [facility])     Past Surgical History:  Procedure Laterality Date  . ABDOMINAL HYSTERECTOMY  11/2006  . NASAL SINUS SURGERY      Social History   Social History  . Marital status: Married    Spouse name: N/A  . Number of children: N/A  . Years of education: N/A   Occupational History  . Not on file.   Social History Main Topics  . Smoking status: Never Smoker  . Smokeless tobacco: Not on file  . Alcohol use No  . Drug use: No  . Sexual activity: Not on file   Other Topics Concern  . Not on file   Social History Narrative  . No narrative on file    Current Outpatient Prescriptions on File Prior to Visit  Medication Sig Dispense Refill  . metFORMIN (GLUCOPHAGE) 500 MG tablet Take 1 tablet (500 mg total) by mouth 2 (two) times daily with a meal. 180 tablet 3  . diclofenac (VOLTAREN) 75 MG EC tablet TAKE 1 TABLET(75 MG) BY MOUTH TWICE DAILY (Patient not taking: Reported on 03/04/2016) 60 tablet 0  . Diclofenac Sodium (PENNSAID) 2 % SOLN Place 2 application onto the skin 2 (two) times daily. (Patient not taking: Reported on 03/04/2016) 112 g 3  . tacrolimus (PROTOPIC) 0.1 % ointment Apply topically 2 (two) times daily. (Patient not taking: Reported on 03/04/2016) 100 g 0  . triamcinolone cream (KENALOG) 0.1 % Apply 1 application topically 2 (two) times daily. (Patient not taking: Reported on 03/04/2016) 30 g 0  . Vitamin D, Ergocalciferol, (DRISDOL) 50000 units CAPS capsule Take  1 capsule (50,000 Units total) by mouth every 7 (seven) days. (Patient not taking: Reported on 03/04/2016) 12 capsule 0   No current facility-administered medications on file prior to visit.     No Known Allergies  Family History  Problem Relation Age of Onset  . Cancer Mother     cervical  . Hypertension Father   . Hyperlipidemia Father   . Hypertension Maternal Aunt   . Diabetes Maternal Aunt   . Hypertension Maternal Uncle   . Diabetes Maternal Uncle   . Hypertension Paternal Aunt   . Diabetes Paternal Aunt   . Hypertension Paternal Uncle   . Diabetes Paternal Uncle   . Hypertension Maternal Grandmother   . Diabetes Maternal Grandmother   . Hypertension Maternal Grandfather   . Diabetes Maternal Grandfather   . Hypertension Paternal Grandmother   . Diabetes Paternal Grandmother   . Hypertension Paternal Grandfather   . Diabetes Paternal Grandfather     BP 122/82   Pulse (!) 104   Ht 5\' 2"  (1.575 m)   Wt 155 lb (70.3 kg)   SpO2 96%   BMI 28.35 kg/m    Review of Systems Denies blurry vision, headache, chest pain, sob, n/v, urinary frequency, muscle cramps, excessive diaphoresis, depression, rhinorrhea, and easy bruising.  She has fatigue.  She has lost a  few lbs.  She has cold intolerance.      Objective:   Physical Exam VS: see vs page GEN: no distress HEAD: head: no deformity eyes: no periorbital swelling, no proptosis external nose and ears are normal mouth: no lesion seen NECK: supple, thyroid is not enlarged CHEST WALL: no deformity LUNGS: clear to auscultation CV: reg rate and rhythm, no murmur ABD: abdomen is soft, nontender.  no hepatosplenomegaly.  not distended.  no hernia MUSCULOSKELETAL: muscle bulk and strength are grossly normal.  no obvious joint swelling.  gait is normal and steady EXTEMITIES: no deformity.  no ulcer on the feet.  feet are of normal color and temp.  no edema PULSES: dorsalis pedis intact bilat.  no carotid bruit NEURO:  cn  2-12 grossly intact.   readily moves all 4's.  sensation is intact to touch on the feet SKIN:  Normal texture and temperature.  No rash or suspicious lesion is visible.   NODES:  None palpable at the neck.  PSYCH: alert, well-oriented.  Does not appear anxious nor depressed.     Lab Results  Component Value Date   CREATININE 0.74 12/18/2015   BUN 14 12/18/2015   NA 137 12/18/2015   K 4.4 12/18/2015   CL 101 12/18/2015   CO2 29 12/18/2015   Lab Results  Component Value Date   TSH 0.49 09/16/2014   Lab Results  Component Value Date   HGBA1C 10.3 03/04/2016   I have reviewed outside records, and summarized: Pt was noted to have elevated a1c, and referred here.  She had been off her meds for a few mos in mid-2017, due to caring for her mother, who ultimately died a few mos ago.      Assessment & Plan:  Type 2 DM: severe exacerbation.  I advised insulin, but she declines.  Fatigue, new to me, prob due to DM.  We'll follow.    Patient is advised the following: Patient Instructions  good diet and exercise significantly improve the control of your diabetes.  please let me know if you wish to be referred to a dietician.  high blood sugar is very risky to your health.  you should see an eye doctor and dentist every year.  It is very important to get all recommended vaccinations.  Controlling your blood pressure and cholesterol drastically reduces the damage diabetes does to your body.  Those who smoke should quit.  Please discuss these with your doctor.  check your blood sugar once a day.  vary the time of day when you check, between before the 3 meals, and at bedtime.  also check if you have symptoms of your blood sugar being too high or too low.  please keep a record of the readings and bring it to your next appointment here (or you can bring the meter itself).  You can write it on any piece of paper.  please call us sooner if your blood sugar goes below 70, or if you have a lot of readings  over 200. Here is a new meter.  I have sent a prescription to your pharmacy, for strips. I have sent a prescription to your pharmacy, to add "tradjenta," and "jardiance."  Call if it is high, so we can add "bromocriptine."  Please come back for a follow-up appointment in 1 month.

## 2016-03-19 ENCOUNTER — Ambulatory Visit: Payer: Managed Care, Other (non HMO) | Admitting: Internal Medicine

## 2016-04-02 ENCOUNTER — Ambulatory Visit: Payer: Managed Care, Other (non HMO) | Admitting: Endocrinology

## 2016-04-11 NOTE — Progress Notes (Deleted)
   Subjective:    Patient ID: Nicole Gallagher, female    DOB: 08-May-1960, 55 y.o.   MRN: [id number]  HPI Pt returns for f/u of diabetes mellitus: DM type: 2 Dx'ed: 2013 Complications: none Therapy: 3 oral meds GDM: G0 DKA: never Severe hypoglycemia: never Pancreatitis: never Other: she declines insulin Interval history:    Review of Systems     Objective:   Physical Exam VITAL SIGNS:  See vs page GENERAL: no distress Pulses: dorsalis pedis intact bilat.   MSK: no deformity of the feet CV: no leg edema Skin:  no ulcer on the feet.  normal color and temp on the feet. Neuro: sensation is intact to touch on the feet        Assessment & Plan:  Type 2 DM:

## 2016-04-12 ENCOUNTER — Ambulatory Visit: Payer: Managed Care, Other (non HMO) | Admitting: Endocrinology

## 2016-09-01 ENCOUNTER — Other Ambulatory Visit (INDEPENDENT_AMBULATORY_CARE_PROVIDER_SITE_OTHER): Payer: Managed Care, Other (non HMO)

## 2016-09-01 ENCOUNTER — Ambulatory Visit (INDEPENDENT_AMBULATORY_CARE_PROVIDER_SITE_OTHER): Payer: Managed Care, Other (non HMO) | Admitting: Internal Medicine

## 2016-09-01 ENCOUNTER — Encounter: Payer: Self-pay | Admitting: Internal Medicine

## 2016-09-01 VITALS — BP 130/78 | HR 120 | Temp 98.5°F | Resp 12 | Ht 62.0 in | Wt 154.0 lb

## 2016-09-01 DIAGNOSIS — R21 Rash and other nonspecific skin eruption: Secondary | ICD-10-CM | POA: Diagnosis not present

## 2016-09-01 LAB — COMPREHENSIVE METABOLIC PANEL
ALBUMIN: 4.6 g/dL (ref 3.5–5.2)
ALT: 11 U/L (ref 0–35)
AST: 10 U/L (ref 0–37)
Alkaline Phosphatase: 173 U/L — ABNORMAL HIGH (ref 39–117)
BILIRUBIN TOTAL: 0.6 mg/dL (ref 0.2–1.2)
BUN: 16 mg/dL (ref 6–23)
CALCIUM: 10.1 mg/dL (ref 8.4–10.5)
CO2: 27 meq/L (ref 19–32)
CREATININE: 0.69 mg/dL (ref 0.40–1.20)
Chloride: 103 mEq/L (ref 96–112)
GFR: 113.24 mL/min (ref >60.00)
Glucose, Bld: 182 mg/dL — ABNORMAL HIGH (ref 70–99)
Potassium: 4.1 mEq/L (ref 3.5–5.1)
SODIUM: 138 meq/L (ref 135–145)
Total Protein: 8.2 g/dL (ref 6.0–8.3)

## 2016-09-01 LAB — HEMOGLOBIN A1C: Hgb A1c MFr Bld: 9.3 % — ABNORMAL HIGH (ref 4.6–6.5)

## 2016-09-01 LAB — VITAMIN D 25 HYDROXY (VIT D DEFICIENCY, FRACTURES): VITD: 13.68 ng/mL — ABNORMAL LOW (ref 30.00–100.00)

## 2016-09-01 LAB — CBC
HCT: 43.6 % (ref 36.0–46.0)
Hemoglobin: 13.9 g/dL (ref 12.0–15.0)
MCHC: 31.9 g/dL (ref 30.0–36.0)
MCV: 71.3 fl — AB (ref 78.0–100.0)
Platelets: 380 10*3/uL (ref 150.0–400.0)
RBC: 6.12 Mil/uL — ABNORMAL HIGH (ref 3.87–5.11)
RDW: 16.5 % — AB (ref 11.5–15.5)
WBC: 6.1 10*3/uL (ref 4.0–10.5)

## 2016-09-01 LAB — TSH: TSH: 0.5 u[IU]/mL (ref 0.35–4.50)

## 2016-09-01 LAB — VITAMIN B12: VITAMIN B 12: 692 pg/mL (ref 211–911)

## 2016-09-01 MED ORDER — TRIAMCINOLONE ACETONIDE 0.1 % EX CREA: 1.0000 "application " | TOPICAL_CREAM | Freq: Two times a day (BID) | CUTANEOUS | 0 refills | Status: DC

## 2016-09-01 NOTE — Assessment & Plan Note (Signed)
Rx for triamcinolone cream today. Checking HgA1c, thyroid, CMP, CBC, vitamin D, vitamin B12 to rule out metabolic causes. This could be related to her poorly controlled sugars. Advised not to scratch and use benadryl cream as well and benadryl PO at night time if needed for itching.

## 2016-09-01 NOTE — Progress Notes (Signed)
   Subjective:    Patient ID: Nicole Gallagher, female    DOB: 30-Jun-1960, 56 y.o.   MRN: [id number]  HPI The patient is a 56 YO female coming in for rash which starts as itching. She has some small areas which start and then spread with itching. She does have diabetes which is uncontrolled and she states is about the same as usual right now. She denies new allergy exposures, new detergent or perfumes. Started about 3 weeks ago. No other changes she can recall. No new medications, changes in dosing, or otc meds that she stopped or started taking. No food changes. She is itching the area which is itching as she is not able to prevent. She has not tried anything for it. The spots do heal within several days and sometimes leave dark spots. She denies any constitutional symptoms.   Review of Systems  Constitutional: Negative.   Respiratory: Negative.   Cardiovascular: Negative.   Gastrointestinal: Negative.   Musculoskeletal: Negative.   Skin: Positive for color change and rash. Negative for pallor and wound.       Itching  Neurological: Negative.       Objective:   Physical Exam  Constitutional: She is oriented to person, place, and time. She appears well-developed and well-nourished.  HENT:  Head: Normocephalic and atraumatic.  Eyes: EOM are normal.  Neck: Normal range of motion.  Cardiovascular: Normal rate and regular rhythm.   Pulmonary/Chest: Effort normal.  Abdominal: Soft. She exhibits no distension. There is no tenderness. There is no rebound.  Neurological: She is alert and oriented to person, place, and time. Coordination normal.  Skin: Skin is warm and dry. Rash noted.  Rash on the arm and leg, none on the palms or soles, none on the face, no rash on the back   Vitals:   09/01/16 0933  BP: 130/78  Pulse: (!) 120  Resp: 12  Temp: 98.5 F (36.9 C)  TempSrc: Oral  SpO2: 99%  Weight: 154 lb (69.9 kg)  Height:  (1.575 m)      Assessment & Plan:

## 2016-09-01 NOTE — Patient Instructions (Signed)
We have sent in the cream to use on the areas even when you can feel them coming on.   We are checking the labs today to look for any causes.   Make sure to lotion up daily to help with dry skin.   You can use benadryl at night time to help with itching. There is even benadryl cream that you can use during the day as needed to help with itching.

## 2016-09-01 NOTE — Progress Notes (Signed)
Pre visit review using our clinic review tool, if applicable. No additional management support is needed unless otherwise documented below in the visit note. 

## 2016-10-04 LAB — HM DIABETES EYE EXAM

## 2016-10-05 ENCOUNTER — Other Ambulatory Visit: Payer: Self-pay | Admitting: *Deleted

## 2016-10-05 NOTE — Telephone Encounter (Signed)
Pt left msg on triage requesting call bck. Called pt back no answer LMOM RTC...Raechel Chute/lmb

## 2016-10-08 ENCOUNTER — Telehealth: Payer: Self-pay | Admitting: Internal Medicine

## 2016-10-08 MED ORDER — HYDROQUINONE 2 % EX CREA: TOPICAL_CREAM | Freq: Two times a day (BID) | CUTANEOUS | 0 refills | Status: DC

## 2016-10-08 NOTE — Telephone Encounter (Signed)
Spoke with [facility] to inform.

## 2016-10-08 NOTE — Telephone Encounter (Signed)
Pt left msg on triage staitng saw MD last week concerning a rash. Rash has not went away wanting to see if MD would call in Hydroquinone cream to help w/sxs. MD is not in office pls advise...Raechel Chute/lmb

## 2016-10-08 NOTE — Telephone Encounter (Signed)
Yes can send in - pending -- not sure where she wants it sent.

## 2016-10-08 NOTE — Telephone Encounter (Signed)
Called pt no answer LMOM rx sent to [facility]../lmb 

## 2016-10-08 NOTE — Telephone Encounter (Signed)
Yes, ok 

## 2016-10-08 NOTE — Telephone Encounter (Signed)
They state hydroquinone 2 % cream Does not come in 2%, only 4%, would like to know if they can dispense 4%.

## 2016-11-19 ENCOUNTER — Ambulatory Visit (INDEPENDENT_AMBULATORY_CARE_PROVIDER_SITE_OTHER): Payer: Managed Care, Other (non HMO) | Admitting: Family Medicine

## 2016-11-19 ENCOUNTER — Encounter: Payer: Self-pay | Admitting: Family Medicine

## 2016-11-19 VITALS — BP 130/80 | HR 97 | Temp 98.3°F | Resp 12 | Ht 62.0 in | Wt 158.0 lb

## 2016-11-19 DIAGNOSIS — S39012A Strain of muscle, fascia and tendon of lower back, initial encounter: Secondary | ICD-10-CM | POA: Diagnosis not present

## 2016-11-19 MED ORDER — CYCLOBENZAPRINE HCL 10 MG PO TABS: 10.0000 mg | ORAL_TABLET | Freq: Two times a day (BID) | ORAL | 0 refills | Status: DC | PRN
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
## 2016-11-19 NOTE — Patient Instructions (Signed)

## 2017-03-02 ENCOUNTER — Ambulatory Visit: Payer: Managed Care, Other (non HMO) | Admitting: Endocrinology

## 2017-03-02 ENCOUNTER — Ambulatory Visit (INDEPENDENT_AMBULATORY_CARE_PROVIDER_SITE_OTHER): Payer: Managed Care, Other (non HMO) | Admitting: Endocrinology

## 2017-03-02 ENCOUNTER — Encounter: Payer: Self-pay | Admitting: Endocrinology

## 2017-03-02 VITALS — BP 120/78 | HR 120 | Wt 157.8 lb

## 2017-03-02 DIAGNOSIS — E1165 Type 2 diabetes mellitus with hyperglycemia: Secondary | ICD-10-CM

## 2017-03-02 LAB — POCT GLYCOSYLATED HEMOGLOBIN (HGB A1C): HEMOGLOBIN A1C: 9.7

## 2017-03-02 MED ORDER — LINAGLIPTIN 5 MG PO TABS: 5.0000 mg | ORAL_TABLET | Freq: Every day | ORAL | 11 refills | Status: DC

## 2017-03-02 MED ORDER — METFORMIN HCL ER 500 MG PO TB24: 2000.0000 mg | ORAL_TABLET | Freq: Every day | ORAL | 3 refills | Status: DC

## 2017-03-02 NOTE — Patient Instructions (Addendum)
check your blood sugar once a day.  vary the time of day when you check, between before the 3 meals, and at bedtime.  also check if you have symptoms of your blood sugar being too high or too low.  please keep a record of the readings and bring it to your next appointment here (or you can bring the meter itself).  You can write it on any piece of paper.  please call us sooner if your blood sugar goes below 70, or if you have a lot of readings over 200. I have sent a prescription to your pharmacy, for metformin, "tradjenta," and "jardiance."  Call if it is high, so we can add "bromocriptine."   Please come back for a follow-up appointment in 3 months.

## 2017-03-02 NOTE — Progress Notes (Signed)
Subjective:    Patient ID: Nicole Gallagher, female    DOB: 1960/10/15, 56 y.o.   MRN: [id number]  HPI  Pt returns for f/u of diabetes mellitus: DM type: 2 Dx'ed: 2013 Complications: none Therapy: 2 oral meds GDM: never (G0) DKA: never Severe hypoglycemia: never Pancreatitis: never Pancreatic imaging:  Other: she has never been on insulin Interval history: She does not take any of her DM meds.  pt states she feels well in general. Past Medical History:  Diagnosis Date  . Allergy   . Diabetes mellitus without complication [facility])     Past Surgical History:  Procedure Laterality Date  . ABDOMINAL HYSTERECTOMY  11/2006  . NASAL SINUS SURGERY      Social History   Social History  . Marital status: Married    Spouse name: N/A  . Number of children: N/A  . Years of education: N/A   Occupational History  . Not on file.   Social History Main Topics  . Smoking status: Never Smoker  . Smokeless tobacco: Never Used  . Alcohol use No  . Drug use: No  . Sexual activity: Not on file   Other Topics Concern  . Not on file   Social History Narrative  . No narrative on file    Current Outpatient Prescriptions on File Prior to Visit  Medication Sig Dispense Refill  . glucose blood (ACCU-CHEK GUIDE) test strip 1 each by Other route daily. And lancets 1/day 100 each 3  . hydroquinone 2 % cream Apply topically 2 (two) times daily. 28.35 g 0  . tacrolimus (PROTOPIC) 0.1 % ointment Apply topically 2 (two) times daily. 100 g 0  . triamcinolone cream (KENALOG) 0.1 % Apply 1 application topically 2 (two) times daily. 453.6 g 0   No current facility-administered medications on file prior to visit.     No Known Allergies  Family History  Problem Relation Age of Onset  . Cancer Mother        cervical  . Hypertension Father   . Hyperlipidemia Father   . Hypertension Maternal Aunt   . Diabetes Maternal Aunt   . Hypertension Maternal Uncle   . Diabetes Maternal Uncle   .  Hypertension Paternal Aunt   . Diabetes Paternal Aunt   . Hypertension Paternal Uncle   . Diabetes Paternal Uncle   . Hypertension Maternal Grandmother   . Diabetes Maternal Grandmother   . Hypertension Maternal Grandfather   . Diabetes Maternal Grandfather   . Hypertension Paternal Grandmother   . Diabetes Paternal Grandmother   . Hypertension Paternal Grandfather   . Diabetes Paternal Grandfather     BP 120/78   Pulse (!) 120   Wt 157 lb 12.8 oz (71.6 kg)   SpO2 97%   BMI 28.86 kg/m   Review of Systems She denies hypoglycemia    Objective:   Physical Exam VITAL SIGNS:  See vs page GENERAL: no distress Pulses: foot pulses are intact bilaterally.   MSK: no deformity of the feet or ankles.  CV: no edema of the legs or ankles Skin:  no ulcer on the feet or ankles.  normal color and temp on the feet and ankles Neuro: sensation is intact to touch on the feet and ankles.     Lab Results  Component Value Date   TSH 0.50 09/01/2016   Lab Results  Component Value Date   HGBA1C 9.7 03/02/2017      Assessment & Plan:  Type 2 DM: worse Tachycardia,  uncertain etiology: recheck next time Noncompliance with cbg recording, meds, and ov's. We discussed risks  Patient Instructions  check your blood sugar once a day.  vary the time of day when you check, between before the 3 meals, and at bedtime.  also check if you have symptoms of your blood sugar being too high or too low.  please keep a record of the readings and bring it to your next appointment here (or you can bring the meter itself).  You can write it on any piece of paper.  please call us sooner if your blood sugar goes below 70, or if you have a lot of readings over 200. I have sent a prescription to your pharmacy, for metformin, "tradjenta," and "jardiance."  Call if it is high, so we can add "bromocriptine."   Please come back for a follow-up appointment in 3 months.

## 2017-03-04 ENCOUNTER — Other Ambulatory Visit: Payer: Self-pay | Admitting: Endocrinology

## 2017-03-27 ENCOUNTER — Other Ambulatory Visit: Payer: Self-pay | Admitting: Endocrinology

## 2017-05-26 ENCOUNTER — Other Ambulatory Visit: Payer: Self-pay | Admitting: Endocrinology

## 2017-06-02 ENCOUNTER — Ambulatory Visit: Payer: Managed Care, Other (non HMO) | Admitting: Endocrinology

## 2017-06-20 ENCOUNTER — Other Ambulatory Visit: Payer: Self-pay | Admitting: Endocrinology

## 2017-08-02 LAB — HM DIABETES EYE EXAM

## 2017-11-30 LAB — HM MAMMOGRAPHY

## 2017-12-14 ENCOUNTER — Encounter (HOSPITAL_COMMUNITY): Payer: Self-pay

## 2017-12-14 ENCOUNTER — Emergency Department (HOSPITAL_COMMUNITY): Payer: Managed Care, Other (non HMO)

## 2017-12-14 ENCOUNTER — Emergency Department (HOSPITAL_COMMUNITY)
Admission: EM | Admit: 2017-12-14 | Discharge: 2017-12-14 | Disposition: A | Discharge status: Home / Self Care | Payer: Managed Care, Other (non HMO) | Attending: Emergency Medicine | Admitting: Emergency Medicine

## 2017-12-14 ENCOUNTER — Other Ambulatory Visit: Payer: Self-pay

## 2017-12-14 DIAGNOSIS — Y999 Unspecified external cause status: Secondary | ICD-10-CM | POA: Diagnosis not present

## 2017-12-14 DIAGNOSIS — M7918 Myalgia, other site: Secondary | ICD-10-CM

## 2017-12-14 DIAGNOSIS — M25552 Pain in left hip: Secondary | ICD-10-CM | POA: Insufficient documentation

## 2017-12-14 DIAGNOSIS — E119 Type 2 diabetes mellitus without complications: Secondary | ICD-10-CM | POA: Insufficient documentation

## 2017-12-14 DIAGNOSIS — M545 Low back pain: Secondary | ICD-10-CM | POA: Insufficient documentation

## 2017-12-14 DIAGNOSIS — Y9389 Activity, other specified: Secondary | ICD-10-CM | POA: Diagnosis not present

## 2017-12-14 DIAGNOSIS — M25512 Pain in left shoulder: Secondary | ICD-10-CM | POA: Insufficient documentation

## 2017-12-14 DIAGNOSIS — Y92410 Unspecified street and highway as the place of occurrence of the external cause: Secondary | ICD-10-CM | POA: Insufficient documentation

## 2017-12-14 DIAGNOSIS — M25551 Pain in right hip: Secondary | ICD-10-CM | POA: Insufficient documentation

## 2017-12-14 DIAGNOSIS — Z79899 Other long term (current) drug therapy: Secondary | ICD-10-CM | POA: Diagnosis not present

## 2017-12-14 MED ORDER — ACETAMINOPHEN 325 MG PO TABS
650.0000 mg | ORAL_TABLET | Freq: Once | ORAL | Status: AC
  Administered 2017-12-14: 650 mg via ORAL
  Filled 2017-12-14: qty 2

## 2017-12-14 MED ORDER — NAPROXEN 500 MG PO TABS: 500.0000 mg | ORAL_TABLET | Freq: Two times a day (BID) | ORAL | 0 refills | Status: DC

## 2017-12-14 MED ORDER — METHOCARBAMOL 500 MG PO TABS: 1000.0000 mg | ORAL_TABLET | Freq: Four times a day (QID) | ORAL | 0 refills | Status: DC
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
## 2017-12-22 ENCOUNTER — Ambulatory Visit (INDEPENDENT_AMBULATORY_CARE_PROVIDER_SITE_OTHER): Payer: Managed Care, Other (non HMO) | Admitting: Internal Medicine

## 2017-12-22 ENCOUNTER — Encounter: Payer: Self-pay | Admitting: Internal Medicine

## 2017-12-22 DIAGNOSIS — G44319 Acute post-traumatic headache, not intractable: Secondary | ICD-10-CM

## 2017-12-22 DIAGNOSIS — M791 Myalgia, unspecified site: Secondary | ICD-10-CM | POA: Diagnosis not present

## 2017-12-22 DIAGNOSIS — R51 Headache: Secondary | ICD-10-CM

## 2017-12-22 DIAGNOSIS — F0781 Postconcussional syndrome: Secondary | ICD-10-CM | POA: Insufficient documentation

## 2017-12-22 DIAGNOSIS — R519 Headache, unspecified: Secondary | ICD-10-CM | POA: Insufficient documentation

## 2017-12-22 MED ORDER — CYCLOBENZAPRINE HCL 5 MG PO TABS: 5.0000 mg | ORAL_TABLET | Freq: Three times a day (TID) | ORAL | 1 refills | Status: DC | PRN

## 2017-12-22 MED ORDER — NAPROXEN 500 MG PO TABS: 500.0000 mg | ORAL_TABLET | Freq: Two times a day (BID) | ORAL | 0 refills | Status: DC
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
## 2017-12-22 NOTE — Patient Instructions (Signed)
We have sent in flexeril to try as a muscle relaxer instead of the robaxin.   You can keep taking the naproxen and can also take tylenol for pain.
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
## 2018-01-06 ENCOUNTER — Telehealth: Payer: Self-pay | Admitting: Internal Medicine
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
## 2018-01-09 DIAGNOSIS — Z0279 Encounter for issue of other medical certificate: Secondary | ICD-10-CM

## 2018-01-09 NOTE — Telephone Encounter (Signed)
Forms have been signed, Faxed to [facility] @ [phone], Copy sent to scan & charged for.  Original mailed to patient.

## 2018-01-16 ENCOUNTER — Other Ambulatory Visit: Payer: Self-pay | Admitting: Internal Medicine

## 2018-03-07 ENCOUNTER — Ambulatory Visit (INDEPENDENT_AMBULATORY_CARE_PROVIDER_SITE_OTHER): Payer: Managed Care, Other (non HMO) | Admitting: Internal Medicine

## 2018-03-07 ENCOUNTER — Encounter: Payer: Self-pay | Admitting: Internal Medicine

## 2018-03-07 ENCOUNTER — Other Ambulatory Visit (INDEPENDENT_AMBULATORY_CARE_PROVIDER_SITE_OTHER): Payer: Managed Care, Other (non HMO)

## 2018-03-07 ENCOUNTER — Telehealth: Payer: Self-pay | Admitting: Internal Medicine

## 2018-03-07 VITALS — BP 122/80 | HR 106 | Temp 98.3°F | Ht 62.0 in | Wt 156.0 lb

## 2018-03-07 DIAGNOSIS — R21 Rash and other nonspecific skin eruption: Secondary | ICD-10-CM

## 2018-03-07 DIAGNOSIS — E1165 Type 2 diabetes mellitus with hyperglycemia: Secondary | ICD-10-CM

## 2018-03-07 DIAGNOSIS — R82998 Other abnormal findings in urine: Secondary | ICD-10-CM

## 2018-03-07 LAB — LIPID PANEL
Cholesterol: 227 mg/dL — ABNORMAL HIGH (ref 0–200)
HDL: 63 mg/dL (ref >39.00)
LDL Cholesterol: 144 mg/dL — ABNORMAL HIGH (ref 0–99)
NonHDL: 163.95
Total CHOL/HDL Ratio: 4
Triglycerides: 99 mg/dL (ref 0.0–149.0)
VLDL: 19.8 mg/dL (ref 0.0–40.0)

## 2018-03-07 LAB — CBC
HEMATOCRIT: 39.3 % (ref 36.0–46.0)
Hemoglobin: 12.8 g/dL (ref 12.0–15.0)
MCHC: 32.5 g/dL (ref 30.0–36.0)
MCV: 72.1 fl — AB (ref 78.0–100.0)
Platelets: 336 10*3/uL (ref 150.0–400.0)
RBC: 5.44 Mil/uL — AB (ref 3.87–5.11)
RDW: 15.2 % (ref 11.5–15.5)
WBC: 4.3 10*3/uL (ref 4.0–10.5)

## 2018-03-07 LAB — MICROALBUMIN / CREATININE URINE RATIO
CREATININE, U: 117.4 mg/dL
MICROALB/CREAT RATIO: 1.5 mg/g (ref 0.0–30.0)
Microalb, Ur: 1.8 mg/dL (ref 0.0–1.9)

## 2018-03-07 LAB — COMPREHENSIVE METABOLIC PANEL
ALBUMIN: 4.5 g/dL (ref 3.5–5.2)
ALT: 13 U/L (ref 0–35)
AST: 12 U/L (ref 0–37)
Alkaline Phosphatase: 163 U/L — ABNORMAL HIGH (ref 39–117)
BUN: 13 mg/dL (ref 6–23)
CALCIUM: 9.8 mg/dL (ref 8.4–10.5)
CHLORIDE: 101 meq/L (ref 96–112)
CO2: 27 mEq/L (ref 19–32)
CREATININE: 0.67 mg/dL (ref 0.40–1.20)
GFR: 116.52 mL/min (ref >60.00)
Glucose, Bld: 196 mg/dL — ABNORMAL HIGH (ref 70–99)
Potassium: 4.1 mEq/L (ref 3.5–5.1)
Sodium: 137 mEq/L (ref 135–145)
Total Bilirubin: 0.7 mg/dL (ref 0.2–1.2)
Total Protein: 7.7 g/dL (ref 6.0–8.3)

## 2018-03-07 LAB — POCT URINALYSIS DIPSTICK
BILIRUBIN UA: NEGATIVE
Blood, UA: NEGATIVE
Glucose, UA: POSITIVE — AB
Ketones, UA: NEGATIVE
Leukocytes, UA: NEGATIVE
Nitrite, UA: NEGATIVE
PH UA: 6 (ref 5.0–8.0)
Protein, UA: NEGATIVE
Spec Grav, UA: >=1.03 — AB (ref 1.010–1.025)
Urobilinogen, UA: 0.2 E.U./dL

## 2018-03-07 LAB — HEMOGLOBIN A1C: HEMOGLOBIN A1C: 10.9 % — AB (ref 4.6–6.5)

## 2018-03-07 MED ORDER — TRIAMCINOLONE ACETONIDE 0.1 % EX CREA: 1.0000 "application " | TOPICAL_CREAM | Freq: Two times a day (BID) | CUTANEOUS | 0 refills | Status: DC

## 2018-03-07 MED ORDER — HYDROQUINONE 2 % EX CREA: TOPICAL_CREAM | Freq: Two times a day (BID) | CUTANEOUS | 0 refills | Status: DC

## 2018-03-07 NOTE — Telephone Encounter (Signed)
Fine, same sig otherwise

## 2018-03-07 NOTE — Telephone Encounter (Signed)
Copied from CRM [id number]. Topic: General - Other >> Mar 07, 2018  1:46 PM Herby Abraham C wrote: Reason for CRM: pharmacy called in to get the okay to change Rx for hydroquinone 2 % cream  to 4%, pharmacy states that there is no such thing as 2%  Please advise.    [phone]

## 2018-03-07 NOTE — Patient Instructions (Addendum)
We have sent in the cream for the face and the arms.   We are checking labs today and need you to come back in 3 months for a visit about the sugars. Let us know if you do not get adjusted to metformin.   There is sugar in the urine today which means the sugar levels are higher than they should be.

## 2018-03-07 NOTE — Telephone Encounter (Signed)
Called and changed Rx to 4% with same sig

## 2018-03-07 NOTE — Progress Notes (Addendum)
   Subjective:    Patient ID: Nicole Gallagher, female    DOB: 06-Nov-1960, 57 y.o.   MRN: [id number]  HPI The patient is a 57 YO female coming in for several concerns including rash (several spots on her arms which pop up and then itch and then get larger and then go away, has happened a couple of times over the last year, denies using anything for it including cream, denies fevers or chills, no new soaps or lotion or perfume or detergent or outdoor exposure), urine symptoms (having urgency and some foaminess to the urine, denies color or odor change, is hydrating well, denies abdominal or back pain) and lack of follow up of her diabetes (no HgA1c in > 1 year, no care for this anywhere, she was supposed to be taking metformin and admits to still taking this but last rx per our system was more than 1 year ago for 1 year's worth, denies numbness but some tingling rare in feet, denies sores, denies recent eye exam).   Review of Systems  Constitutional: Negative.   HENT: Negative.   Eyes: Negative.   Respiratory: Negative for cough, chest tightness and shortness of breath.   Cardiovascular: Negative for chest pain, palpitations and leg swelling.  Gastrointestinal: Negative for abdominal distention, abdominal pain, constipation, diarrhea, nausea and vomiting.  Genitourinary: Positive for urgency.       Foamy urine  Musculoskeletal: Negative.   Skin: Positive for rash.  Neurological: Negative.        Tingling in feet  Psychiatric/Behavioral: Negative.       Objective:   Physical Exam  Constitutional: She is oriented to person, place, and time. She appears well-developed and well-nourished.  HENT:  Head: Normocephalic and atraumatic.  Eyes: EOM are normal.  Neck: Normal range of motion.  Cardiovascular: Normal rate and regular rhythm.  Pulmonary/Chest: Effort normal and breath sounds normal. No respiratory distress. She has no wheezes. She has no rales.  Abdominal: Soft. Bowel sounds are normal.  She exhibits no distension. There is no tenderness. There is no rebound.  Musculoskeletal: She exhibits no edema.  Neurological: She is alert and oriented to person, place, and time. Coordination normal.  Skin: Skin is warm and dry.  Psychiatric: She has a normal mood and affect.   Vitals:   03/07/18 1123  BP: 122/80  Pulse: (!) 106  Temp: 98.3 F (36.8 C)  TempSrc: Oral  SpO2: 98%  Weight: 156 lb (70.8 kg)  Height: 5\' 2"  (1.575 m)      Assessment & Plan:

## 2018-03-09 DIAGNOSIS — R82998 Other abnormal findings in urine: Secondary | ICD-10-CM | POA: Insufficient documentation

## 2018-03-09 LAB — ANA, IFA COMPREHENSIVE PANEL
ANA: NEGATIVE
ENA SM AB SER-ACNC: NEGATIVE AI
SCLERODERMA (SCL-70) (ENA) ANTIBODY, IGG: NEGATIVE AI
SM/RNP: <1 AI
SSA (Ro) (ENA) Antibody, IgG: <1 AI
SSB (La) (ENA) Antibody, IgG: <1 AI
ds DNA Ab: 1 IU/mL

## 2018-03-09 NOTE — Assessment & Plan Note (Signed)
POC done which did show glucose and microalbumin to check for kidney damage from diabetes. She likely has uncontrolled diabetes which can cause similar symptoms to hers reported. No infection today.

## 2018-03-09 NOTE — Assessment & Plan Note (Signed)
Admits to taking metformin as prescribed but has not run out of medication as she should have. Checking HgA1c, microalbumin to creatinine ratio. Reminded about eye exam. Checking lipid panel. Taking metformin and not on ARB or ACE-I or statin. Adjust as needed. Needs follow up depending on HgA1c.

## 2018-03-09 NOTE — Assessment & Plan Note (Addendum)
Rx for triamcinolone ointment to help with the areas of rash. Advised not to itch. Concern for high sugars which can cause rash or itching and evaluating that as well. Checking ANA to rule out lupus.

## 2018-03-10 ENCOUNTER — Other Ambulatory Visit: Payer: Self-pay | Admitting: Internal Medicine

## 2018-03-10 MED ORDER — SIMVASTATIN 40 MG PO TABS: 40.0000 mg | ORAL_TABLET | Freq: Every day | ORAL | 3 refills | Status: DC

## 2018-03-10 MED ORDER — EMPAGLIFLOZIN 25 MG PO TABS: 25.0000 mg | ORAL_TABLET | Freq: Every day | ORAL | 0 refills | Status: DC

## 2018-03-13 ENCOUNTER — Telehealth: Payer: Self-pay | Admitting: Internal Medicine

## 2018-03-13 NOTE — Telephone Encounter (Signed)
PA approved 02/11/2018;Coverage End Date:03/13/2019;

## 2018-03-13 NOTE — Telephone Encounter (Signed)
Copied from CRM [id number]. Topic: Quick Communication - See Telephone Encounter >> Mar 13, 2018 10:27 AM Louie Bun, Rosey Bath D wrote: CRM for notification. See Telephone encounter for: 03/13/18. Pt called and said that her pharmacy told her that her medication empagliflozin (JARDIANCE) 25 MG TABS tablet needs prior auth and she would like to know if the office can start that for her. If there are any questions to call pt back, thanks.

## 2018-03-13 NOTE — Telephone Encounter (Signed)
PA started on CoverMyMeds KEY: [id number]

## 2018-03-24 NOTE — Progress Notes (Signed)
Abstracted and sent to scan  

## 2018-06-27 ENCOUNTER — Other Ambulatory Visit (INDEPENDENT_AMBULATORY_CARE_PROVIDER_SITE_OTHER): Payer: BLUE CROSS/BLUE SHIELD

## 2018-06-27 ENCOUNTER — Encounter: Payer: Self-pay | Admitting: Internal Medicine

## 2018-06-27 ENCOUNTER — Ambulatory Visit (INDEPENDENT_AMBULATORY_CARE_PROVIDER_SITE_OTHER): Payer: BLUE CROSS/BLUE SHIELD | Admitting: Internal Medicine

## 2018-06-27 VITALS — BP 110/70 | HR 97 | Temp 98.0°F | Ht 62.0 in | Wt 154.0 lb

## 2018-06-27 DIAGNOSIS — R198 Other specified symptoms and signs involving the digestive system and abdomen: Secondary | ICD-10-CM | POA: Insufficient documentation

## 2018-06-27 DIAGNOSIS — E1165 Type 2 diabetes mellitus with hyperglycemia: Secondary | ICD-10-CM | POA: Diagnosis not present

## 2018-06-27 LAB — COMPREHENSIVE METABOLIC PANEL
ALBUMIN: 4.6 g/dL (ref 3.5–5.2)
ALK PHOS: 186 U/L — AB (ref 39–117)
ALT: 15 U/L (ref 0–35)
AST: 14 U/L (ref 0–37)
BUN: 13 mg/dL (ref 6–23)
CALCIUM: 10.1 mg/dL (ref 8.4–10.5)
CO2: 28 mEq/L (ref 19–32)
Chloride: 103 mEq/L (ref 96–112)
Creatinine, Ser: 0.61 mg/dL (ref 0.40–1.20)
GFR: 122.03 mL/min (ref >60.00)
Glucose, Bld: 136 mg/dL — ABNORMAL HIGH (ref 70–99)
Potassium: 4.1 mEq/L (ref 3.5–5.1)
Sodium: 138 mEq/L (ref 135–145)
Total Bilirubin: 0.6 mg/dL (ref 0.2–1.2)
Total Protein: 8.2 g/dL (ref 6.0–8.3)

## 2018-06-27 LAB — CBC
HCT: 43.5 % (ref 36.0–46.0)
Hemoglobin: 13.8 g/dL (ref 12.0–15.0)
MCHC: 31.7 g/dL (ref 30.0–36.0)
MCV: 72.4 fl — ABNORMAL LOW (ref 78.0–100.0)
PLATELETS: 349 10*3/uL (ref 150.0–400.0)
RBC: 6.01 Mil/uL — AB (ref 3.87–5.11)
RDW: 16.4 % — ABNORMAL HIGH (ref 11.5–15.5)
WBC: 5.4 10*3/uL (ref 4.0–10.5)

## 2018-06-27 LAB — LIPASE: Lipase: 23 U/L (ref 11.0–59.0)

## 2018-06-27 LAB — HEMOGLOBIN A1C: Hgb A1c MFr Bld: 10.5 % — ABNORMAL HIGH (ref 4.6–6.5)

## 2018-06-27 NOTE — Progress Notes (Signed)
   Subjective:   Patient ID: Nicole Gallagher, female    DOB: August 25, 1960, 58 y.o.   MRN: [id number]  HPI The patient is a 58 YO female coming in for change in bowels. She normally goes about once per week. She is now going 1-2 times per day since Friday. She has been drinking more water recently to be healthy. She denies change in diet, new restaurants or known sick contacts. Denies nausea or vomiting. She is also having light brown stools instead of darker brown stool. Denies any medication changes or vitamin changes either starting or stopping. Did start jardiance but stopped taking metformin. Is taking cholesterol medication for last several months as well. Denies weight change. Denies fevers or chills. Does have mild discomfort prior to [facility] which is relieved with BM.   Review of Systems  Constitutional: Negative.   HENT: Negative.   Eyes: Negative.   Respiratory: Negative for cough, chest tightness and shortness of breath.   Cardiovascular: Negative for chest pain, palpitations and leg swelling.  Gastrointestinal: Negative for abdominal distention, abdominal pain, constipation, diarrhea, nausea and vomiting.       Change in stools  Musculoskeletal: Negative.   Skin: Negative.   Neurological: Negative.   Psychiatric/Behavioral: Negative.     Objective:  Physical Exam Constitutional:      Appearance: She is well-developed. She is obese.  HENT:     Head: Normocephalic and atraumatic.  Neck:     Musculoskeletal: Normal range of motion.  Cardiovascular:     Rate and Rhythm: Normal rate and regular rhythm.  Pulmonary:     Effort: Pulmonary effort is normal. No respiratory distress.     Breath sounds: Normal breath sounds. No wheezing or rales.  Abdominal:     General: Bowel sounds are normal. There is no distension.     Palpations: Abdomen is soft.     Tenderness: There is no abdominal tenderness. There is no rebound.  Skin:    General: Skin is warm and dry.  Neurological:   Mental Status: She is alert and oriented to person, place, and time.     Coordination: Coordination normal.     Vitals:   06/27/18 1433  BP: 110/70  Pulse: 97  Temp: 98 F (36.7 C)  TempSrc: Oral  SpO2: 99%  Weight: 154 lb (69.9 kg)  Height: 5\' 2"  (1.575 m)    Assessment & Plan:

## 2018-06-27 NOTE — Assessment & Plan Note (Signed)
Needs HgA1c today as she did not return for follow up. Taking jardiance but stopped metformin on her own. Did start statin.

## 2018-06-27 NOTE — Assessment & Plan Note (Signed)
Suspect normal and could be related to recent increase in hydration. Recent colonoscopy without problems in 2018. Checking CBC, CMP, lipase and HgA1c.

## 2018-06-28 ENCOUNTER — Other Ambulatory Visit: Payer: Self-pay | Admitting: Internal Medicine

## 2018-06-29 DIAGNOSIS — E119 Type 2 diabetes mellitus without complications: Secondary | ICD-10-CM | POA: Diagnosis not present

## 2018-06-29 LAB — HM DIABETES EYE EXAM

## 2018-07-04 ENCOUNTER — Encounter: Payer: Self-pay | Admitting: Internal Medicine

## 2018-07-04 NOTE — Progress Notes (Signed)
Abstracted and sent to scan  

## 2019-01-25 DIAGNOSIS — Z1231 Encounter for screening mammogram for malignant neoplasm of breast: Secondary | ICD-10-CM | POA: Diagnosis not present

## 2019-01-25 DIAGNOSIS — N959 Unspecified menopausal and perimenopausal disorder: Secondary | ICD-10-CM | POA: Diagnosis not present

## 2019-01-25 DIAGNOSIS — Z01419 Encounter for gynecological examination (general) (routine) without abnormal findings: Secondary | ICD-10-CM | POA: Diagnosis not present

## 2019-01-25 DIAGNOSIS — Z6827 Body mass index (BMI) 27.0-27.9, adult: Secondary | ICD-10-CM | POA: Diagnosis not present

## 2019-07-05 ENCOUNTER — Other Ambulatory Visit: Payer: Self-pay

## 2019-07-05 ENCOUNTER — Encounter: Payer: Self-pay | Admitting: Internal Medicine

## 2019-07-05 ENCOUNTER — Ambulatory Visit (INDEPENDENT_AMBULATORY_CARE_PROVIDER_SITE_OTHER): Payer: BC Managed Care – PPO | Admitting: Internal Medicine

## 2019-07-05 VITALS — BP 138/86 | HR 102 | Temp 98.2°F | Ht 62.0 in | Wt 155.8 lb

## 2019-07-05 DIAGNOSIS — R0789 Other chest pain: Secondary | ICD-10-CM | POA: Diagnosis not present

## 2019-07-05 DIAGNOSIS — R5383 Other fatigue: Secondary | ICD-10-CM | POA: Diagnosis not present

## 2019-07-05 DIAGNOSIS — E785 Hyperlipidemia, unspecified: Secondary | ICD-10-CM

## 2019-07-05 DIAGNOSIS — E1165 Type 2 diabetes mellitus with hyperglycemia: Secondary | ICD-10-CM | POA: Diagnosis not present

## 2019-07-05 DIAGNOSIS — E1169 Type 2 diabetes mellitus with other specified complication: Secondary | ICD-10-CM

## 2019-07-05 LAB — COMPREHENSIVE METABOLIC PANEL
ALT: 13 U/L (ref 0–35)
AST: 12 U/L (ref 0–37)
Albumin: 4.2 g/dL (ref 3.5–5.2)
Alkaline Phosphatase: 176 U/L — ABNORMAL HIGH (ref 39–117)
BUN: 10 mg/dL (ref 6–23)
CO2: 30 mEq/L (ref 19–32)
Calcium: 10.1 mg/dL (ref 8.4–10.5)
Chloride: 98 mEq/L (ref 96–112)
Creatinine, Ser: 0.65 mg/dL (ref 0.40–1.20)
GFR: 113 mL/min (ref >60.00)
Glucose, Bld: 233 mg/dL — ABNORMAL HIGH (ref 70–99)
Potassium: 4 mEq/L (ref 3.5–5.1)
Sodium: 135 mEq/L (ref 135–145)
Total Bilirubin: 0.6 mg/dL (ref 0.2–1.2)
Total Protein: 7.5 g/dL (ref 6.0–8.3)

## 2019-07-05 LAB — CBC
HCT: 40 % (ref 36.0–46.0)
Hemoglobin: 12.9 g/dL (ref 12.0–15.0)
MCHC: 32.3 g/dL (ref 30.0–36.0)
MCV: 73.5 fl — ABNORMAL LOW (ref 78.0–100.0)
Platelets: 331 10*3/uL (ref 150.0–400.0)
RBC: 5.44 Mil/uL — ABNORMAL HIGH (ref 3.87–5.11)
RDW: 15.8 % — ABNORMAL HIGH (ref 11.5–15.5)
WBC: 4.5 10*3/uL (ref 4.0–10.5)

## 2019-07-05 LAB — LIPID PANEL
Cholesterol: 238 mg/dL — ABNORMAL HIGH (ref 0–200)
HDL: 67.3 mg/dL (ref >39.00)
LDL Cholesterol: 149 mg/dL — ABNORMAL HIGH (ref 0–99)
NonHDL: 170.9
Total CHOL/HDL Ratio: 4
Triglycerides: 111 mg/dL (ref 0.0–149.0)
VLDL: 22.2 mg/dL (ref 0.0–40.0)

## 2019-07-05 LAB — HEMOGLOBIN A1C: Hgb A1c MFr Bld: 10.5 % — ABNORMAL HIGH (ref 4.6–6.5)

## 2019-07-05 LAB — VITAMIN B12: Vitamin B-12: 913 pg/mL — ABNORMAL HIGH (ref 211–911)

## 2019-07-05 LAB — TSH: TSH: 0.67 u[IU]/mL (ref 0.35–4.50)

## 2019-07-05 LAB — VITAMIN D 25 HYDROXY (VIT D DEFICIENCY, FRACTURES): VITD: 14 ng/mL — ABNORMAL LOW (ref 30.00–100.00)

## 2019-07-05 NOTE — Patient Instructions (Signed)
The EKG of the heart looks normal. We are checking the blood work today.  We will likely need to adjust the sugar medicines as high sugars can make you feel tired.

## 2019-07-05 NOTE — Progress Notes (Signed)
   Subjective:   Patient ID: Nicole Gallagher, female    DOB: 01-Jun-1960, 59 y.o.   MRN: [id number]  HPI The patient is a 59 YO female coming in for several concerns including chest tightness (off and on, sometimes at rest and sometimes with doing things, feels like a pressure on her chest, non-smoker but with poorly controlled diabetes, denies much exercise lately or activity), poorly controlled diabetes (taking jardiance only, not taking metformin as advised as prior visit, not on cholesterol medicine given she is out, complicated by hyperlipidemia, denies excessive thirst/urination) and fatigue (poor energy levels and some stomach discomfort, denies weight change, no diet changes in the last year, taking jardiance only and out of cholesterol medicine, sleeping okay but not feeling rested well).   Review of Systems  Constitutional: Positive for activity change and fatigue. Negative for appetite change, chills and unexpected weight change.  HENT: Negative.   Eyes: Negative.   Respiratory: Positive for chest tightness. Negative for cough and shortness of breath.   Cardiovascular: Negative for chest pain, palpitations and leg swelling.  Gastrointestinal: Negative for abdominal distention, abdominal pain, constipation, diarrhea, nausea and vomiting.  Musculoskeletal: Negative.   Skin: Negative.   Neurological: Negative.   Psychiatric/Behavioral: Negative.     Objective:  Physical Exam Constitutional:      Appearance: She is well-developed. She is obese.  HENT:     Head: Normocephalic and atraumatic.  Cardiovascular:     Rate and Rhythm: Normal rate and regular rhythm.  Pulmonary:     Effort: Pulmonary effort is normal. No respiratory distress.     Breath sounds: Normal breath sounds. No wheezing or rales.  Abdominal:     General: Bowel sounds are normal. There is no distension.     Palpations: Abdomen is soft.     Tenderness: There is no abdominal tenderness. There is no rebound.    Musculoskeletal:     Cervical back: Normal range of motion.  Skin:    General: Skin is warm and dry.  Neurological:     Mental Status: She is alert and oriented to person, place, and time.     Coordination: Coordination normal.     Vitals:   07/05/19 0929  BP: 138/86  Pulse: (!) 102  Temp: 98.2 F (36.8 C)  TempSrc: Oral  SpO2: 98%  Weight: 155 lb 12.8 oz (70.7 kg)  Height: 5\' 2"  (1.575 m)   EKG: Rate 94, axis normal, intervals normal, sinus, no st or t wave changes, no prior to compare  This visit occurred during the SARS-CoV-2 public health emergency.  Safety protocols were in place, including screening questions prior to the visit, additional usage of staff PPE, and extensive cleaning of exam room while observing appropriate contact time as indicated for disinfecting solutions.   Assessment & Plan:  Visit time 35 minutes in face to face communication with patient and coordination of care, additional 5 minutes spent in record review, coordination or care, ordering tests, communicating/referring to other healthcare professionals, documenting in medical records all on the same day of the visit for total time 40 minutes spent on the visit.

## 2019-07-06 DIAGNOSIS — R0789 Other chest pain: Secondary | ICD-10-CM | POA: Insufficient documentation

## 2019-07-06 DIAGNOSIS — E785 Hyperlipidemia, unspecified: Secondary | ICD-10-CM | POA: Insufficient documentation

## 2019-07-06 DIAGNOSIS — E1169 Type 2 diabetes mellitus with other specified complication: Secondary | ICD-10-CM | POA: Insufficient documentation

## 2019-07-06 NOTE — Assessment & Plan Note (Signed)
Checking thyroid, vitamin levels, HgA1c. Suspect this is stemming partly from poorly controlled sugar levels. She is strongly encouraged to make a significant effort with this.

## 2019-07-06 NOTE — Assessment & Plan Note (Signed)
Not clearly cardiac by history. EKG done in the office without concerning findings. Will work on control of diabetes and see if this improves.

## 2019-07-06 NOTE — Assessment & Plan Note (Signed)
Off meds currently but previously prescribed simvastatin. Checking lipid panel and adjust as needed.

## 2019-07-06 NOTE — Assessment & Plan Note (Signed)
Previously poorly controlled and given no change in medications or diet since last visit suspect still poorly controlled. We talked about the risk of long term consequences of poorly controlled diabetes.

## 2019-07-18 ENCOUNTER — Telehealth: Payer: Self-pay | Admitting: Internal Medicine

## 2019-07-18 NOTE — Telephone Encounter (Signed)
Attempted to contact pt, could not leave vm mailbox was full. Will try again soon.

## 2019-07-18 NOTE — Telephone Encounter (Signed)
   Patient states she was told she needs to adjust medications for cholesterol and diabetes based on lab results, however nothing has been called to the pharmacy.  Please call

## 2019-07-18 NOTE — Telephone Encounter (Signed)
I never got any response from the result note if she was willing to make changes to her regimen for diabetes. Is she willing to make changes?

## 2019-07-20 ENCOUNTER — Telehealth: Payer: Self-pay

## 2019-07-20 NOTE — Telephone Encounter (Signed)
New message      Returning call back to CMA from Wednesday.

## 2019-07-21 NOTE — Telephone Encounter (Signed)
Spoke to pt and she stated that she agreed to change DM med and cholesterol med per PCP recommendation.   Pt is requesting a refill for the hydroquinone 2% cream for the dark spots on her face. Pt stated that it has been a while since it was prescribed and she has just run out of it.

## 2019-07-31 MED ORDER — METFORMIN HCL 1000 MG PO TABS: 1000.0000 mg | ORAL_TABLET | Freq: Two times a day (BID) | ORAL | 3 refills | Status: DC

## 2019-07-31 MED ORDER — JARDIANCE 25 MG PO TABS: 25.0000 mg | ORAL_TABLET | Freq: Every day | ORAL | 1 refills | Status: DC

## 2019-07-31 MED ORDER — SIMVASTATIN 40 MG PO TABS: 40.0000 mg | ORAL_TABLET | Freq: Every day | ORAL | 3 refills | Status: DC

## 2019-07-31 NOTE — Addendum Note (Signed)
Addended by: Hillard Danker A on: 07/31/2019 01:38 PM   Modules accepted: Orders

## 2019-07-31 NOTE — Addendum Note (Signed)
Addended by: Verlan Friends on: 07/31/2019 01:46 PM   Modules accepted: Orders

## 2019-07-31 NOTE — Telephone Encounter (Signed)
Sent in metformin to take 1 pill daily for 1 week, then increase to 1 pill twice a day. Keep also taking jardiance. For cholesterol refilled her simvastatin to take 1 pill daily.

## 2019-07-31 NOTE — Telephone Encounter (Signed)
We can discuss cream at upcoming visit since it was not discussed at visit.

## 2019-07-31 NOTE — Telephone Encounter (Signed)
Patient is calling requesting the CMA give her a call back about her medications.

## 2019-07-31 NOTE — Telephone Encounter (Signed)
Pt informed of DM and lipid rx to pharmacy.   Pt is requesting refill of the hydroquinone. Please advise if okay to fill.

## 2019-09-13 DIAGNOSIS — E119 Type 2 diabetes mellitus without complications: Secondary | ICD-10-CM | POA: Diagnosis not present

## 2019-10-05 ENCOUNTER — Ambulatory Visit: Payer: BC Managed Care – PPO | Admitting: Internal Medicine

## 2019-10-05 DIAGNOSIS — Z0289 Encounter for other administrative examinations: Secondary | ICD-10-CM

## 2019-10-11 DIAGNOSIS — E119 Type 2 diabetes mellitus without complications: Secondary | ICD-10-CM | POA: Diagnosis not present
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
## 2020-01-24 ENCOUNTER — Other Ambulatory Visit: Payer: Self-pay | Admitting: Internal Medicine

## 2020-04-01 DIAGNOSIS — H608X3 Other otitis externa, bilateral: Secondary | ICD-10-CM | POA: Diagnosis not present

## 2020-04-01 DIAGNOSIS — J343 Hypertrophy of nasal turbinates: Secondary | ICD-10-CM | POA: Diagnosis not present

## 2020-04-01 DIAGNOSIS — J310 Chronic rhinitis: Secondary | ICD-10-CM | POA: Diagnosis not present

## 2020-04-08 ENCOUNTER — Other Ambulatory Visit: Payer: Self-pay | Admitting: Otolaryngology

## 2020-04-08 DIAGNOSIS — J320 Chronic maxillary sinusitis: Secondary | ICD-10-CM

## 2020-04-09 ENCOUNTER — Other Ambulatory Visit: Payer: BC Managed Care – PPO

## 2020-04-29 ENCOUNTER — Other Ambulatory Visit: Payer: Self-pay

## 2020-04-29 ENCOUNTER — Ambulatory Visit
Admission: RE | Admit: 2020-04-29 | Discharge: 2020-04-29 | Disposition: A | Discharge status: Home / Self Care | Payer: BC Managed Care – PPO | Source: Ambulatory Visit | Attending: Otolaryngology | Admitting: Otolaryngology

## 2020-04-29 DIAGNOSIS — J320 Chronic maxillary sinusitis: Secondary | ICD-10-CM

## 2020-04-29 DIAGNOSIS — J3489 Other specified disorders of nose and nasal sinuses: Secondary | ICD-10-CM | POA: Diagnosis not present

## 2020-04-29 DIAGNOSIS — J342 Deviated nasal septum: Secondary | ICD-10-CM | POA: Diagnosis not present

## 2020-04-29 DIAGNOSIS — J323 Chronic sphenoidal sinusitis: Secondary | ICD-10-CM | POA: Diagnosis not present

## 2020-04-29 DIAGNOSIS — J0130 Acute sphenoidal sinusitis, unspecified: Secondary | ICD-10-CM | POA: Diagnosis not present

## 2020-05-05 DIAGNOSIS — J310 Chronic rhinitis: Secondary | ICD-10-CM | POA: Diagnosis not present

## 2020-05-05 DIAGNOSIS — R040 Epistaxis: Secondary | ICD-10-CM | POA: Diagnosis not present

## 2020-05-05 DIAGNOSIS — J343 Hypertrophy of nasal turbinates: Secondary | ICD-10-CM | POA: Diagnosis not present

## 2020-06-07 LAB — HM DIABETES EYE EXAM

## 2020-08-05 DIAGNOSIS — Z6826 Body mass index (BMI) 26.0-26.9, adult: Secondary | ICD-10-CM | POA: Diagnosis not present

## 2020-08-05 DIAGNOSIS — Z1231 Encounter for screening mammogram for malignant neoplasm of breast: Secondary | ICD-10-CM | POA: Diagnosis not present

## 2020-08-05 DIAGNOSIS — Z01419 Encounter for gynecological examination (general) (routine) without abnormal findings: Secondary | ICD-10-CM | POA: Diagnosis not present

## 2020-08-22 DIAGNOSIS — E042 Nontoxic multinodular goiter: Secondary | ICD-10-CM | POA: Diagnosis not present

## 2020-09-18 DIAGNOSIS — E042 Nontoxic multinodular goiter: Secondary | ICD-10-CM | POA: Diagnosis not present

## 2020-09-26 ENCOUNTER — Ambulatory Visit
Admission: RE | Admit: 2020-09-26 | Discharge: 2020-09-26 | Disposition: A | Discharge status: Home / Self Care | Payer: Self-pay | Source: Ambulatory Visit | Attending: Surgery | Admitting: Surgery

## 2020-09-26 ENCOUNTER — Other Ambulatory Visit: Payer: Self-pay | Admitting: Surgery

## 2020-09-26 DIAGNOSIS — E041 Nontoxic single thyroid nodule: Secondary | ICD-10-CM

## 2020-10-01 DIAGNOSIS — Z03818 Encounter for observation for suspected exposure to other biological agents ruled out: Secondary | ICD-10-CM | POA: Diagnosis not present

## 2020-10-01 DIAGNOSIS — Z20822 Contact with and (suspected) exposure to covid-19: Secondary | ICD-10-CM | POA: Diagnosis not present

## 2020-10-06 ENCOUNTER — Other Ambulatory Visit: Payer: Self-pay | Admitting: Surgery

## 2020-10-06 DIAGNOSIS — E041 Nontoxic single thyroid nodule: Secondary | ICD-10-CM

## 2020-10-15 ENCOUNTER — Ambulatory Visit
Admission: RE | Admit: 2020-10-15 | Discharge: 2020-10-15 | Disposition: A | Discharge status: Home / Self Care | Payer: BC Managed Care – PPO | Source: Ambulatory Visit | Attending: Surgery | Admitting: Surgery

## 2020-10-15 ENCOUNTER — Other Ambulatory Visit (HOSPITAL_COMMUNITY)
Admission: RE | Admit: 2020-10-15 | Discharge: 2020-10-15 | Disposition: A | Discharge status: Home / Self Care | Payer: BC Managed Care – PPO | Source: Ambulatory Visit | Attending: Student | Admitting: Student

## 2020-10-15 DIAGNOSIS — E041 Nontoxic single thyroid nodule: Secondary | ICD-10-CM

## 2020-10-15 DIAGNOSIS — R896 Abnormal cytological findings in specimens from other organs, systems and tissues: Secondary | ICD-10-CM | POA: Diagnosis not present

## 2020-10-16 LAB — CYTOLOGY - NON PAP

## 2020-10-20 ENCOUNTER — Other Ambulatory Visit: Payer: Self-pay | Admitting: Internal Medicine

## 2020-10-21 DIAGNOSIS — E041 Nontoxic single thyroid nodule: Secondary | ICD-10-CM | POA: Diagnosis not present

## 2020-10-23 DIAGNOSIS — Z20822 Contact with and (suspected) exposure to covid-19: Secondary | ICD-10-CM | POA: Diagnosis not present

## 2020-10-29 DIAGNOSIS — E042 Nontoxic multinodular goiter: Secondary | ICD-10-CM | POA: Diagnosis present

## 2020-11-06 ENCOUNTER — Encounter (HOSPITAL_COMMUNITY): Payer: Self-pay

## 2020-12-31 ENCOUNTER — Ambulatory Visit (HOSPITAL_BASED_OUTPATIENT_CLINIC_OR_DEPARTMENT_OTHER): Admit: 2020-12-31 | Payer: BC Managed Care – PPO | Admitting: Obstetrics and Gynecology

## 2020-12-31 ENCOUNTER — Encounter (HOSPITAL_BASED_OUTPATIENT_CLINIC_OR_DEPARTMENT_OTHER): Payer: Self-pay

## 2020-12-31 SURGERY — SALPINGO-OOPHORECTOMY, LAPAROSCOPIC
Anesthesia: Choice | Laterality: Bilateral

## 2021-01-07 ENCOUNTER — Ambulatory Visit (INDEPENDENT_AMBULATORY_CARE_PROVIDER_SITE_OTHER): Payer: BC Managed Care – PPO | Admitting: Internal Medicine

## 2021-01-07 ENCOUNTER — Other Ambulatory Visit: Payer: Self-pay

## 2021-01-07 ENCOUNTER — Encounter: Payer: Self-pay | Admitting: Internal Medicine

## 2021-01-07 VITALS — BP 122/64 | HR 97 | Temp 98.4°F | Resp 18 | Ht 62.0 in | Wt 147.2 lb

## 2021-01-07 DIAGNOSIS — E1165 Type 2 diabetes mellitus with hyperglycemia: Secondary | ICD-10-CM | POA: Diagnosis not present

## 2021-01-07 DIAGNOSIS — E1169 Type 2 diabetes mellitus with other specified complication: Secondary | ICD-10-CM | POA: Diagnosis not present

## 2021-01-07 DIAGNOSIS — Z0001 Encounter for general adult medical examination with abnormal findings: Secondary | ICD-10-CM | POA: Diagnosis not present

## 2021-01-07 DIAGNOSIS — E785 Hyperlipidemia, unspecified: Secondary | ICD-10-CM

## 2021-01-07 DIAGNOSIS — E041 Nontoxic single thyroid nodule: Secondary | ICD-10-CM

## 2021-01-07 LAB — LIPID PANEL
Cholesterol: 286 mg/dL — ABNORMAL HIGH (ref 0–200)
HDL: 79.7 mg/dL (ref >39.00)
LDL Cholesterol: 179 mg/dL — ABNORMAL HIGH (ref 0–99)
NonHDL: 206.65
Total CHOL/HDL Ratio: 4
Triglycerides: 137 mg/dL (ref 0.0–149.0)
VLDL: 27.4 mg/dL (ref 0.0–40.0)

## 2021-01-07 LAB — COMPREHENSIVE METABOLIC PANEL
ALT: 14 U/L (ref 0–35)
AST: 11 U/L (ref 0–37)
Albumin: 4.3 g/dL (ref 3.5–5.2)
Alkaline Phosphatase: 190 U/L — ABNORMAL HIGH (ref 39–117)
BUN: 12 mg/dL (ref 6–23)
CO2: 28 mEq/L (ref 19–32)
Calcium: 9.9 mg/dL (ref 8.4–10.5)
Chloride: 99 mEq/L (ref 96–112)
Creatinine, Ser: 0.58 mg/dL (ref 0.40–1.20)
GFR: 98.5 mL/min (ref >60.00)
Glucose, Bld: 224 mg/dL — ABNORMAL HIGH (ref 70–99)
Potassium: 4.3 mEq/L (ref 3.5–5.1)
Sodium: 135 mEq/L (ref 135–145)
Total Bilirubin: 0.7 mg/dL (ref 0.2–1.2)
Total Protein: 7.9 g/dL (ref 6.0–8.3)

## 2021-01-07 LAB — T4, FREE: Free T4: 0.57 ng/dL — ABNORMAL LOW (ref 0.60–1.60)

## 2021-01-07 LAB — CBC
HCT: 41 % (ref 36.0–46.0)
Hemoglobin: 13.2 g/dL (ref 12.0–15.0)
MCHC: 32.2 g/dL (ref 30.0–36.0)
MCV: 72.7 fl — ABNORMAL LOW (ref 78.0–100.0)
Platelets: 343 10*3/uL (ref 150.0–400.0)
RBC: 5.63 Mil/uL — ABNORMAL HIGH (ref 3.87–5.11)
RDW: 15.9 % — ABNORMAL HIGH (ref 11.5–15.5)
WBC: 4.2 10*3/uL (ref 4.0–10.5)

## 2021-01-07 LAB — HEMOGLOBIN A1C: Hgb A1c MFr Bld: 11.3 % — ABNORMAL HIGH (ref 4.6–6.5)

## 2021-01-07 LAB — MICROALBUMIN / CREATININE URINE RATIO
Creatinine,U: 154.4 mg/dL
Microalb Creat Ratio: 1.6 mg/g (ref 0.0–30.0)
Microalb, Ur: 2.5 mg/dL — ABNORMAL HIGH (ref 0.0–1.9)

## 2021-01-07 LAB — TSH: TSH: 0.75 u[IU]/mL (ref 0.35–5.50)

## 2021-01-07 MED ORDER — MONTELUKAST SODIUM 10 MG PO TABS: 10.0000 mg | ORAL_TABLET | Freq: Every day | ORAL | 1 refills | Status: DC

## 2021-01-07 NOTE — Progress Notes (Signed)
   Subjective:   Patient ID: Nicole Gallagher, female    DOB: [birth year], 60 y.o.   MRN: [id number]  HPI The patient is a 60 YO female coming in for physical.  HPI #2: Some new neuropathy in the legs in the last month. Has poorly controlled diabetes without recent follow up of this.   PMH, [facility], social history reviewed and updated  Review of Systems  Constitutional: Negative.   HENT: Negative.    Eyes: Negative.   Respiratory:  Negative for cough, chest tightness and shortness of breath.   Cardiovascular:  Negative for chest pain, palpitations and leg swelling.  Gastrointestinal:  Negative for abdominal distention, abdominal pain, constipation, diarrhea, nausea and vomiting.  Musculoskeletal: Negative.   Skin: Negative.   Neurological:  Positive for numbness.  Psychiatric/Behavioral: Negative.     Objective:  Physical Exam Constitutional:      Appearance: She is well-developed.  HENT:     Head: Normocephalic and atraumatic.  Cardiovascular:     Rate and Rhythm: Normal rate and regular rhythm.  Pulmonary:     Effort: Pulmonary effort is normal. No respiratory distress.     Breath sounds: Normal breath sounds. No wheezing or rales.  Abdominal:     General: Bowel sounds are normal. There is no distension.     Palpations: Abdomen is soft.     Tenderness: There is no abdominal tenderness. There is no rebound.  Musculoskeletal:     Cervical back: Normal range of motion.  Skin:    General: Skin is warm and dry.     Comments: Foot exam with neuropathy  Neurological:     Mental Status: She is alert and oriented to person, place, and time.     Coordination: Coordination normal.    Vitals:   01/07/21 1024  BP: 122/64  Pulse: 97  Resp: 18  Temp: 98.4 F (36.9 C)  TempSrc: Oral  SpO2: 99%  Weight: 147 lb 3.2 oz (66.8 kg)  Height: 5\' 2"  (1.575 m)    This visit occurred during the SARS-CoV-2 public health emergency.  Safety protocols were in place, including screening  questions prior to the visit, additional usage of staff PPE, and extensive cleaning of exam room while observing appropriate contact time as indicated for disinfecting solutions.   Assessment & Plan:

## 2021-01-07 NOTE — Patient Instructions (Addendum)
Come back and get the shingles vaccine.  We have sent in the singulair to take for the allergies daily.

## 2021-01-09 DIAGNOSIS — Z0001 Encounter for general adult medical examination with abnormal findings: Secondary | ICD-10-CM | POA: Insufficient documentation

## 2021-01-09 DIAGNOSIS — E041 Nontoxic single thyroid nodule: Secondary | ICD-10-CM | POA: Insufficient documentation

## 2021-01-09 NOTE — Assessment & Plan Note (Signed)
I see recent US and biopsy from ob/gyn and she is due for repeat US next year. Checking TSH and free T4.

## 2021-01-09 NOTE — Assessment & Plan Note (Signed)
Flu shot yearly at work. Covid-19 counseled. Pneumonia declines. Shingrix counseled. Tetanus counseled. Colonoscopy up to date. Mammogram counseled, pap smear counseled. Counseled about sun safety and mole surveillance. Counseled about the dangers of distracted driving. Given 10 year screening recommendations.

## 2021-01-09 NOTE — Assessment & Plan Note (Signed)
Having new neuropathy and not on medications for diabetes. Previously uncontrolled and the new neuropathy makes poor control likely. We did discuss today is she is ready to care for her diabetes. If we start new medication we would want follow up in 3 months. We did discuss her serious risk for complication since her HgA1c has been >8 for some time. Foot exam done.

## 2021-01-09 NOTE — Assessment & Plan Note (Signed)
Not on medication due to lack of follow up and she is unsure about this. Checking lipid panel today. Goal LDL <70.

## 2021-01-16 ENCOUNTER — Telehealth: Payer: Self-pay | Admitting: Internal Medicine

## 2021-01-16 DIAGNOSIS — E1165 Type 2 diabetes mellitus with hyperglycemia: Secondary | ICD-10-CM

## 2021-01-16 NOTE — Telephone Encounter (Signed)
Patient has few questions.. requesting call back from nurse  Callback [phone]

## 2021-01-19 NOTE — Telephone Encounter (Signed)
Called patient. LVM asking her to return my call at the office. Office number was provided.   If patient calls back please get more detail as to what she wants to discuss. Thanks.

## 2021-02-03 NOTE — Telephone Encounter (Signed)
Patient says she has questions regarding her recent test results  Please call [phone]

## 2021-02-04 NOTE — Telephone Encounter (Signed)
Patient calling back in to discuss recent rest results  Please call [phone]

## 2021-02-04 NOTE — Telephone Encounter (Signed)
Spoke with the patient and she is willing to start on medication for her diabetes. She is also wanting a referral to a nutritionist. 3 month f/u has been scheduled for 05/05/2021 at 8:40 am.

## 2021-02-05 MED ORDER — METFORMIN HCL 1000 MG PO TABS: 1000.0000 mg | ORAL_TABLET | Freq: Two times a day (BID) | ORAL | 3 refills | Status: DC

## 2021-02-05 MED ORDER — SIMVASTATIN 20 MG PO TABS: 20.0000 mg | ORAL_TABLET | Freq: Every day | ORAL | 3 refills | Status: DC

## 2021-02-05 NOTE — Telephone Encounter (Signed)
Medication sent in for cholesterol and sugars. Nutrition referral done.

## 2021-02-05 NOTE — Addendum Note (Signed)
Addended by: Hillard Danker A on: 02/05/2021 11:31 AM   Modules accepted: Orders

## 2021-02-12 ENCOUNTER — Telehealth: Payer: Self-pay | Admitting: Internal Medicine

## 2021-02-12 DIAGNOSIS — E039 Hypothyroidism, unspecified: Secondary | ICD-10-CM

## 2021-02-12 NOTE — Telephone Encounter (Signed)
Patient would like to take the low dose thyroid medicine as mentioned in lab results  Also, the patient wants to discuss metformin, she doesn't feel like it agrees with her  Please call her at [phone]

## 2021-02-13 MED ORDER — LEVOTHYROXINE SODIUM 50 MCG PO TABS: 50.0000 ug | ORAL_TABLET | Freq: Every day | ORAL | 3 refills | Status: DC

## 2021-02-13 NOTE — Telephone Encounter (Signed)
Called patient. LVM asking her to call back to discuss her metformin. Office number was provided.

## 2021-02-13 NOTE — Telephone Encounter (Signed)
Sent in thyroid medicine.

## 2021-02-20 ENCOUNTER — Ambulatory Visit: Payer: BC Managed Care – PPO

## 2021-03-04 ENCOUNTER — Other Ambulatory Visit: Payer: Self-pay

## 2021-03-04 ENCOUNTER — Ambulatory Visit (INDEPENDENT_AMBULATORY_CARE_PROVIDER_SITE_OTHER): Payer: BC Managed Care – PPO

## 2021-03-04 DIAGNOSIS — Z23 Encounter for immunization: Secondary | ICD-10-CM

## 2021-03-04 NOTE — Progress Notes (Signed)
Pt given 1st shingles vacc w/o any complications. °

## 2021-04-01 ENCOUNTER — Encounter: Payer: Self-pay | Admitting: Dietician

## 2021-04-01 ENCOUNTER — Other Ambulatory Visit: Payer: Self-pay

## 2021-04-01 ENCOUNTER — Encounter: Payer: BC Managed Care – PPO | Attending: Internal Medicine | Admitting: Dietician

## 2021-04-01 DIAGNOSIS — E1169 Type 2 diabetes mellitus with other specified complication: Secondary | ICD-10-CM | POA: Diagnosis not present

## 2021-04-01 NOTE — Patient Instructions (Addendum)
If symptoms of nausea persist with metformin, contact your PCP Dr. Okey Dupre about switching to a different class of medication.  When pricking your fingers, use the side of your finger near the base and set your lancing device to a lower setting. Adjust it up if you need to get a bigger drop of blood.  When checking your blood sugar after eating, wait until 2 hours after you begin eating to test.  Increase your exercise gradually and comfortably. Remember, this lowers your blood sugar and MORE is always MORE.  Work towards eating three meals a day, about 5-6 hours apart!  Begin to recognize carbohydrates in your food choices!  Begin to build your meals using the proportions of the Balanced Plate. First, select your carb choice(s) for the meal. Next, select your source of protein to pair with your carb choice(s). Finally, complete the remaining half of your meal with a variety of non-starchy vegetables.

## 2021-04-01 NOTE — Progress Notes (Signed)
Diabetes Self-Management Education  Visit Type: First/Initial  Appt. Start Time: 0945 Appt. End Time: 1055  04/01/2021  Ms. Nicole Gallagher, identified by name and date of birth, is a 60 y.o. female with a diagnosis of Diabetes: Type 2.   ASSESSMENT Pt spouse Rocky Link is present during the visit Pt has restarted taking metfomin BID for about 2 months, states they are getting nausea after taking their morning dose. Pt states the nausea goes away after they use the bathroom. This has gone on for years since diagnosis and beginning to take metformin in 2017. Pt discontinued metforming for a long period of time due to side effects. Pt checks blood glucose 2 times a day, fasting and 30 minutes after eating. Fasting ranges from the high 100's to low 200's. Post prandial is 250+. Pt reports occasional polydipsia and polyuria, does not check their blood sugar when they are symptomatic. Pt reports occasional tingling and burning in their feet and fingers. Pt unaware these are all symptoms of hyperglycemia. Pt has a varying meal schedule, misses dinner frequently. Eats breakfast around 9:00 am, lunch between 2:00 - 4:00 pm, and dinner may be around 7:00 pm. Pt may pick up some fast food on the way home, usually [employer] or chinese food. Pt walks about 2-3 miles a day at work. Pt has a membership to [employer] but has not been since COVID.   There were no vitals taken for this visit. There is no height or weight on file to calculate BMI.   Diabetes Self-Management Education - 04/01/21 0952       Visit Information   Visit Type First/Initial      Initial Visit   Diabetes Type Type 2    Are you currently following a meal plan? No    Are you taking your medications as prescribed? Yes    Date Diagnosed 2017      Health Coping   How would you rate your overall health? Good      Psychosocial Assessment   Patient Belief/Attitude about Diabetes Motivated to manage diabetes   Pt was in denial upon  first diagnosis   Self-care barriers None    Self-management support Family;Doctor's office    Other persons present Patient;Spouse/SO;Family Member    Patient Concerns Nutrition/Meal planning;Glycemic Control    Special Needs None    Preferred Learning Style No preference indicated    Learning Readiness Ready    How often do you need to have someone help you when you read instructions, pamphlets, or other written materials from your doctor or pharmacy? 1 - Never    What is the last grade level you completed in school? College      Pre-Education Assessment   Patient understands the diabetes disease and treatment process. Needs Instruction    Patient understands incorporating nutritional management into lifestyle. Needs Instruction    Patient undertands incorporating physical activity into lifestyle. Needs Instruction    Patient understands using medications safely. Needs Instruction    Patient understands monitoring blood glucose, interpreting and using results Needs Instruction    Patient understands prevention, detection, and treatment of acute complications. Needs Instruction    Patient understands prevention, detection, and treatment of chronic complications. Needs Instruction    Patient understands how to develop strategies to address psychosocial issues. Needs Instruction    Patient understands how to develop strategies to promote health/change behavior. Needs Instruction      Complications   Last HgB A1C per patient/outside source 11.3 %  01/07/2021   How often do you check your blood sugar? 1-2 times/day    Fasting Blood glucose range (mg/dL) 829-937;>169    Postprandial Blood glucose range (mg/dL) >678    Number of hyperglycemic episodes per week 3    Can you tell when your blood sugar is high? Yes    What do you do if your blood sugar is high? Drink water    Have you had a dilated eye exam in the past 12 months? Yes    Have you had a dental exam in the past 12 months? Yes     Are you checking your feet? Yes    How many days per week are you checking your feet? 3      Dietary Intake   Breakfast Egg mcmuffin without cheese, water    Lunch Smothered chicken, sweet potato, broccoli, some blooming onion    Dinner none    Beverage(s) Water      Exercise   Exercise Type ADL's;Light (walking / raking leaves)    How many days per week to you exercise? 7    How many minutes per day do you exercise? 15    Total minutes per week of exercise 105      Patient Education   Previous Diabetes Education No    Disease state  Definition of diabetes, type 1 and 2, and the diagnosis of diabetes;Factors that contribute to the development of diabetes;Explored patient's options for treatment of their diabetes    Nutrition management  Carbohydrate counting;Food label reading, portion sizes and measuring food.;Role of diet in the treatment of diabetes and the relationship between the three main macronutrients and blood glucose level;Meal options for control of blood glucose level and chronic complications.   Consistent meal timing   Physical activity and exercise  Role of exercise on diabetes management, blood pressure control and cardiac health.;Helped patient identify appropriate exercises in relation to his/her diabetes, diabetes complications and other health issue.    Medications Reviewed patients medication for diabetes, action, purpose, timing of dose and side effects.   Advised pt to contact PCP and switch medications due to side effects   Monitoring Purpose and frequency of SMBG.;Identified appropriate SMBG and/or A1C goals.    Acute complications Discussed and identified patients' treatment of hyperglycemia.    Chronic complications Relationship between chronic complications and blood glucose control;Assessed and discussed foot care and prevention of foot problems;Lipid levels, blood glucose control and heart disease;Retinopathy and reason for yearly dilated eye exams;Nephropathy,  what it is, prevention of, the use of ACE, ARB's and early detection of through urine microalbumia.    Psychosocial adjustment Role of stress on diabetes;Identified and addressed patients feelings and concerns about diabetes      Individualized Goals (developed by patient)   Nutrition Follow meal plan discussed;General guidelines for healthy choices and portions discussed    Physical Activity Exercise 1-2 times per week    Medications take my medication as prescribed;Other (comment)   Contact PCP for different medication options   Monitoring  test my blood glucose as discussed;send in my blood glucose log as discussed;test blood glucose pre and post meals as discussed      Post-Education Assessment   Patient understands the diabetes disease and treatment process. Needs Review    Patient understands incorporating nutritional management into lifestyle. Needs Review    Patient undertands incorporating physical activity into lifestyle. Needs Review    Patient understands using medications safely. Needs Review    Patient  understands monitoring blood glucose, interpreting and using results Needs Review    Patient understands prevention, detection, and treatment of acute complications. Needs Review    Patient understands prevention, detection, and treatment of chronic complications. Needs Review    Patient understands how to develop strategies to address psychosocial issues. Needs Review    Patient understands how to develop strategies to promote health/change behavior. Needs Review      Outcomes   Expected Outcomes Demonstrated interest in learning. Expect positive outcomes    Future DMSE 4-6 wks    Program Status Not Completed             Individualized Plan for Diabetes Self-Management Training:   Learning Objective:  Patient will have a greater understanding of diabetes self-management. Patient education plan is to attend individual and/or group sessions per assessed needs and  concerns.   Plan:   Patient Instructions  If symptoms of nausea persist with metformin, contact your PCP Dr. Okey Dupre about switching to a different class of medication.  When pricking your fingers, use the side of your finger near the base and set your lancing device to a lower setting. Adjust it up if you need to get a bigger drop of blood.  When checking your blood sugar after eating, wait until 2 hours after you begin eating to test.  Increase your exercise gradually and comfortably. Remember, this lowers your blood sugar and MORE is always MORE.  Work towards eating three meals a day, about 5-6 hours apart!  Begin to recognize carbohydrates in your food choices!  Begin to build your meals using the proportions of the Balanced Plate. First, select your carb choice(s) for the meal. Next, select your source of protein to pair with your carb choice(s). Finally, complete the remaining half of your meal with a variety of non-starchy vegetables.   Expected Outcomes:  Demonstrated interest in learning. Expect positive outcomes  Education material provided: My Plate, Snack sheet, and Carbohydrate counting sheet  If problems or questions, patient to contact team via:  Phone and Email  Future DSME appointment: 4-6 wks

## 2021-05-05 ENCOUNTER — Other Ambulatory Visit: Payer: Self-pay

## 2021-05-05 ENCOUNTER — Ambulatory Visit (INDEPENDENT_AMBULATORY_CARE_PROVIDER_SITE_OTHER): Payer: BC Managed Care – PPO | Admitting: Internal Medicine

## 2021-05-05 ENCOUNTER — Encounter: Payer: Self-pay | Admitting: Internal Medicine

## 2021-05-05 DIAGNOSIS — E041 Nontoxic single thyroid nodule: Secondary | ICD-10-CM

## 2021-05-05 DIAGNOSIS — E118 Type 2 diabetes mellitus with unspecified complications: Secondary | ICD-10-CM

## 2021-05-05 DIAGNOSIS — E1169 Type 2 diabetes mellitus with other specified complication: Secondary | ICD-10-CM

## 2021-05-05 DIAGNOSIS — E785 Hyperlipidemia, unspecified: Secondary | ICD-10-CM | POA: Diagnosis not present

## 2021-05-05 LAB — POCT GLYCOSYLATED HEMOGLOBIN (HGB A1C): Hemoglobin A1C: 11.5 % — AB (ref 4.0–5.6)

## 2021-05-05 MED ORDER — GLIMEPIRIDE 1 MG PO TABS: 1.0000 mg | ORAL_TABLET | Freq: Every day | ORAL | 1 refills | Status: DC

## 2021-05-05 MED ORDER — METFORMIN HCL ER 500 MG PO TB24: 500.0000 mg | ORAL_TABLET | Freq: Every day | ORAL | 3 refills | Status: DC

## 2021-05-05 NOTE — Progress Notes (Signed)
° °  Subjective:   Patient ID: Nicole Gallagher, female    DOB: 1961/03/24, 61 y.o.   MRN: [id number]  HPI The patient is a 61 YO female coming in for follow up diabetes. Taking metformin 1000 mg BID every other day or so. Also started low dose thyroid medicine after last visit needs follow up.  Review of Systems  Constitutional: Negative.   HENT: Negative.    Eyes: Negative.   Respiratory:  Negative for cough, chest tightness and shortness of breath.   Cardiovascular:  Negative for chest pain, palpitations and leg swelling.  Gastrointestinal:  Negative for abdominal distention, abdominal pain, constipation, diarrhea, nausea and vomiting.  Musculoskeletal: Negative.   Skin: Negative.   Neurological: Negative.   Psychiatric/Behavioral: Negative.     Objective:  Physical Exam Constitutional:      Appearance: She is well-developed.  HENT:     Head: Normocephalic and atraumatic.  Cardiovascular:     Rate and Rhythm: Normal rate and regular rhythm.  Pulmonary:     Effort: Pulmonary effort is normal. No respiratory distress.     Breath sounds: Normal breath sounds. No wheezing or rales.  Abdominal:     General: Bowel sounds are normal. There is no distension.     Palpations: Abdomen is soft.     Tenderness: There is no abdominal tenderness. There is no rebound.  Musculoskeletal:     Cervical back: Normal range of motion.  Skin:    General: Skin is warm and dry.  Neurological:     Mental Status: She is alert and oriented to person, place, and time.     Coordination: Coordination normal.    Vitals:   05/05/21 0839  BP: 126/72  Pulse: (!) 113  Resp: 18  SpO2: 99%  Weight: 148 lb 12.8 oz (67.5 kg)  Height: 5\' 2"  (1.575 m)    This visit occurred during the SARS-CoV-2 public health emergency.  Safety protocols were in place, including screening questions prior to the visit, additional usage of staff PPE, and extensive cleaning of exam room while observing appropriate contact time  as indicated for disinfecting solutions.   Assessment & Plan:

## 2021-05-05 NOTE — Assessment & Plan Note (Signed)
She is now taking synthroid 50 mcg daily due to low levels at last visit. Advised it is time to get labs which were previously ordered TSH and free T4. Adjust dosing as needed.

## 2021-05-05 NOTE — Addendum Note (Signed)
Addended by: Manuela Schwartz on: 05/05/2021 09:07 AM   Modules accepted: Orders

## 2021-05-05 NOTE — Patient Instructions (Signed)
We will change the metformin to 500 mg extended release to take 1 pill daily.  We have sent in glimepiride to take 1 pill with first meal of the day.

## 2021-05-05 NOTE — Assessment & Plan Note (Addendum)
POC HgA1c done today unchanged at 11.5. She is in severe exacerbation.  She was unable to tolerate metformin. Will change to metformin 500 mg xr daily. Add glimepiride 1 mg daily as well. She has seen nutritionist and is working on dietary changes as well.

## 2021-05-05 NOTE — Assessment & Plan Note (Signed)
Tolerating simvastatin okay.

## 2021-05-27 ENCOUNTER — Encounter: Payer: BC Managed Care – PPO | Attending: Internal Medicine | Admitting: Dietician

## 2021-05-27 DIAGNOSIS — E1169 Type 2 diabetes mellitus with other specified complication: Secondary | ICD-10-CM | POA: Insufficient documentation

## 2021-06-29 DIAGNOSIS — J069 Acute upper respiratory infection, unspecified: Secondary | ICD-10-CM | POA: Diagnosis not present

## 2021-06-29 DIAGNOSIS — Z20822 Contact with and (suspected) exposure to covid-19: Secondary | ICD-10-CM | POA: Diagnosis not present

## 2021-06-29 DIAGNOSIS — R079 Chest pain, unspecified: Secondary | ICD-10-CM | POA: Diagnosis not present

## 2021-06-30 ENCOUNTER — Encounter (HOSPITAL_BASED_OUTPATIENT_CLINIC_OR_DEPARTMENT_OTHER): Payer: Self-pay | Admitting: Obstetrics and Gynecology

## 2021-06-30 ENCOUNTER — Other Ambulatory Visit: Payer: Self-pay

## 2021-06-30 DIAGNOSIS — Z8041 Family history of malignant neoplasm of ovary: Secondary | ICD-10-CM | POA: Diagnosis not present

## 2021-06-30 NOTE — Progress Notes (Signed)
Spoke w/ via phone for pre-op interview--- Nicole Gallagher Lab needs dos----   T&S and CBC            Lab results------ COVID test -----patient states asymptomatic no test needed Arrive at -------0530 NPO after MN NO Solid Food.   Med rec completed Medications to take morning of surgery ----- Synthroid Diabetic medication -----None AM of surgery. Patient instructed no nail polish to be worn day of surgery Patient instructed to bring photo id and insurance card day of surgery Patient aware to have Driver (ride )  (sister)Nicole Gallagher or Nicole Gallagher  / caregiver    for 24 hours after surgery  Patient Special Instructions ----- Pre-Op special Istructions ----- Patient verbalized understanding of instructions that were given at this phone interview. Patient denies shortness of breath, chest pain, fever, cough at this phone interview.

## 2021-07-06 NOTE — Anesthesia Preprocedure Evaluation (Addendum)
Anesthesia Evaluation  ?Patient identified by MRN, date of birth, ID band ?Patient awake ? ? ? ?Reviewed: ?Allergy & Precautions, H&P , NPO status , Patient's Chart, lab work & pertinent test results ? ?Airway ?Mallampati: III ? ?TM Distance: >3 FB ?Neck ROM: Full ? ? ? Dental ?no notable dental hx. ?(+) Teeth Intact, Dental Advisory Given ?  ?Pulmonary ?neg pulmonary ROS,  ?  ?Pulmonary exam normal ?breath sounds clear to auscultation ? ? ? ? ? ? Cardiovascular ?Exercise Tolerance: Good ?negative cardio ROS ? ? ?Rhythm:Regular Rate:Normal ? ? ?  ?Neuro/Psych ? Headaches, negative psych ROS  ? GI/Hepatic ?negative GI ROS, Neg liver ROS,   ?Endo/Other  ?diabetes, Type 2, Oral Hypoglycemic AgentsHypothyroidism  ? Renal/GU ?negative Renal ROS  ?negative genitourinary ?  ?Musculoskeletal ? ? Abdominal ?  ?Peds ? Hematology ?negative hematology ROS ?(+)   ?Anesthesia Other Findings ? ? Reproductive/Obstetrics ?negative OB ROS ? ?  ? ? ? ? ? ? ? ? ? ? ? ? ? ?  ?  ? ? ? ? ? ? ? ?Anesthesia Physical ?Anesthesia Plan ? ?ASA: 2 ? ?Anesthesia Plan: General  ? ?Post-op Pain Management: Tylenol PO (pre-op)* and Toradol IV (intra-op)*  ? ?Induction: Intravenous ? ?PONV Risk Score and Plan: 4 or greater and Ondansetron, Dexamethasone and Midazolam ? ?Airway Management Planned: Oral ETT ? ?Additional Equipment:  ? ?Intra-op Plan:  ? ?Post-operative Plan: Extubation in OR ? ?Informed Consent: I have reviewed the patients History and Physical, chart, labs and discussed the procedure including the risks, benefits and alternatives for the proposed anesthesia with the patient or authorized representative who has indicated his/her understanding and acceptance.  ? ? ? ?Dental advisory given ? ?Plan Discussed with: CRNA ? ?Anesthesia Plan Comments:   ? ? ? ? ? ?Anesthesia Quick Evaluation ? ?

## 2021-07-06 NOTE — H&P (Signed)
Nicole Gallagher is an 61 y.o. female G0P0 S/P LAVH 2008. At that time there were some adhesions around the ovaries that were taken down. Her mother had ovarian cancer in her 30s. Office U/S 06/30/21 noted normal ovaries. She wants BSO due to FHx. ? ?Pertinent Gynecological History: ?Menses:  ?Bleeding:  ?Contraception:  ?DES exposure: denies ?Blood transfusions: none ?Sexually transmitted diseases: no past history ?Previous GYN Procedures:  LAVH   ?Last mammogram: normal Date: 08/05/20 ?Last pap: normal Date: 08/05/20 ?OB History: G0, P0  ? ?Menstrual History: ?Menarche age: unknown ?No LMP recorded. Patient has had a hysterectomy. ?  ? ?Past Medical History:  ?Diagnosis Date  ? Allergy   ? Diabetes mellitus without complication (HCC)   ? Hypothyroidism   ? ? ?Past Surgical History:  ?Procedure Laterality Date  ? ABDOMINAL HYSTERECTOMY  11/2006  ? NASAL SINUS SURGERY    ? ? ?Family History  ?Problem Relation Age of Onset  ? Cancer Mother   ?     cervical  ? Hypertension Father   ? Hyperlipidemia Father   ? Hypertension Maternal Aunt   ? Diabetes Maternal Aunt   ? Hypertension Maternal Uncle   ? Diabetes Maternal Uncle   ? Hypertension Paternal Aunt   ? Diabetes Paternal Aunt   ? Hypertension Paternal Uncle   ? Diabetes Paternal Uncle   ? Hypertension Maternal Grandmother   ? Diabetes Maternal Grandmother   ? Hypertension Maternal Grandfather   ? Diabetes Maternal Grandfather   ? Hypertension Paternal Grandmother   ? Diabetes Paternal Grandmother   ? Hypertension Paternal Grandfather   ? Diabetes Paternal Grandfather   ? ? ?Social History:  reports that she has never smoked. She has never used smokeless tobacco. She reports that she does not drink alcohol and does not use drugs. ? ?Allergies: No Known Allergies ? ?No medications prior to admission.  ? ? ?Review of Systems  ?Constitutional:  Negative for fever.  ? ?Height 5\' 2"  (1.575 m), weight 67.6 kg. ?Physical Exam ?Cardiovascular:  ?   Rate and Rhythm: Normal rate.   ?Pulmonary:  ?   Effort: Pulmonary effort is normal.  ? ? ?No results found for this or any previous visit (from the past 24 hour(s)). ? ?No results found. ? ?Assessment/Plan: ?61 yo with FHx of ovarian cancer ?L/S BSO, possible laparotomy D/W. ?Risks reviewed including infection, organ damage, bleeding/transfusion-HIV/Hep, DVT/PE, pneumonia, pelvic pain. ?She states she understands and agrees. ? ?67 II ?07/06/2021, 5:06 PM ? ?

## 2021-07-07 ENCOUNTER — Ambulatory Visit (HOSPITAL_BASED_OUTPATIENT_CLINIC_OR_DEPARTMENT_OTHER): Payer: BC Managed Care – PPO | Admitting: Anesthesiology

## 2021-07-07 ENCOUNTER — Other Ambulatory Visit: Payer: Self-pay

## 2021-07-07 ENCOUNTER — Inpatient Hospital Stay (HOSPITAL_BASED_OUTPATIENT_CLINIC_OR_DEPARTMENT_OTHER)
Admission: RE | Admit: 2021-07-07 | Discharge: 2021-07-09 | DRG: 982 | Disposition: A | Discharge status: Home / Self Care | Payer: BC Managed Care – PPO | Attending: Internal Medicine | Admitting: Internal Medicine

## 2021-07-07 ENCOUNTER — Encounter (HOSPITAL_BASED_OUTPATIENT_CLINIC_OR_DEPARTMENT_OTHER): Payer: Self-pay | Admitting: Obstetrics and Gynecology

## 2021-07-07 ENCOUNTER — Encounter (HOSPITAL_COMMUNITY)
Admission: RE | Disposition: A | Discharge status: Home / Self Care | Payer: Self-pay | Source: Home / Self Care | Attending: Obstetrics and Gynecology

## 2021-07-07 DIAGNOSIS — Z8249 Family history of ischemic heart disease and other diseases of the circulatory system: Secondary | ICD-10-CM | POA: Diagnosis not present

## 2021-07-07 DIAGNOSIS — E118 Type 2 diabetes mellitus with unspecified complications: Secondary | ICD-10-CM | POA: Diagnosis present

## 2021-07-07 DIAGNOSIS — Z833 Family history of diabetes mellitus: Secondary | ICD-10-CM | POA: Diagnosis not present

## 2021-07-07 DIAGNOSIS — N99840 Postprocedural hematoma of a genitourinary system organ or structure following a genitourinary system procedure: Principal | ICD-10-CM | POA: Diagnosis present

## 2021-07-07 DIAGNOSIS — I700 Atherosclerosis of aorta: Secondary | ICD-10-CM | POA: Diagnosis present

## 2021-07-07 DIAGNOSIS — Z79899 Other long term (current) drug therapy: Secondary | ICD-10-CM | POA: Diagnosis not present

## 2021-07-07 DIAGNOSIS — E039 Hypothyroidism, unspecified: Secondary | ICD-10-CM | POA: Diagnosis not present

## 2021-07-07 DIAGNOSIS — K660 Peritoneal adhesions (postprocedural) (postinfection): Secondary | ICD-10-CM | POA: Diagnosis not present

## 2021-07-07 DIAGNOSIS — Z79890 Hormone replacement therapy (postmenopausal): Secondary | ICD-10-CM

## 2021-07-07 DIAGNOSIS — Z4002 Encounter for prophylactic removal of ovary: Secondary | ICD-10-CM | POA: Diagnosis not present

## 2021-07-07 DIAGNOSIS — E1165 Type 2 diabetes mellitus with hyperglycemia: Secondary | ICD-10-CM | POA: Diagnosis not present

## 2021-07-07 DIAGNOSIS — Z7984 Long term (current) use of oral hypoglycemic drugs: Secondary | ICD-10-CM | POA: Diagnosis not present

## 2021-07-07 DIAGNOSIS — E785 Hyperlipidemia, unspecified: Secondary | ICD-10-CM | POA: Diagnosis not present

## 2021-07-07 DIAGNOSIS — R000 Tachycardia, unspecified: Secondary | ICD-10-CM | POA: Diagnosis present

## 2021-07-07 DIAGNOSIS — I2510 Atherosclerotic heart disease of native coronary artery without angina pectoris: Secondary | ICD-10-CM | POA: Diagnosis present

## 2021-07-07 DIAGNOSIS — D649 Anemia, unspecified: Secondary | ICD-10-CM | POA: Diagnosis present

## 2021-07-07 DIAGNOSIS — Z20822 Contact with and (suspected) exposure to covid-19: Secondary | ICD-10-CM | POA: Diagnosis not present

## 2021-07-07 DIAGNOSIS — E1169 Type 2 diabetes mellitus with other specified complication: Secondary | ICD-10-CM | POA: Diagnosis not present

## 2021-07-07 DIAGNOSIS — D62 Acute posthemorrhagic anemia: Secondary | ICD-10-CM | POA: Diagnosis not present

## 2021-07-07 DIAGNOSIS — N736 Female pelvic peritoneal adhesions (postinfective): Secondary | ICD-10-CM | POA: Diagnosis not present

## 2021-07-07 DIAGNOSIS — Z8041 Family history of malignant neoplasm of ovary: Secondary | ICD-10-CM

## 2021-07-07 DIAGNOSIS — E042 Nontoxic multinodular goiter: Secondary | ICD-10-CM | POA: Diagnosis present

## 2021-07-07 DIAGNOSIS — R109 Unspecified abdominal pain: Secondary | ICD-10-CM | POA: Diagnosis not present

## 2021-07-07 HISTORY — PX: LAPAROTOMY: SHX154

## 2021-07-07 HISTORY — PX: LAPAROSCOPIC SALPINGO OOPHERECTOMY: SHX5927

## 2021-07-07 HISTORY — DX: Hypothyroidism, unspecified: E03.9

## 2021-07-07 LAB — GLUCOSE, CAPILLARY
Glucose-Capillary: 234 mg/dL — ABNORMAL HIGH (ref 70–99)
Glucose-Capillary: 241 mg/dL — ABNORMAL HIGH (ref 70–99)
Glucose-Capillary: 232 mg/dL — ABNORMAL HIGH (ref 70–99)
Glucose-Capillary: 249 mg/dL — ABNORMAL HIGH (ref 70–99)
Glucose-Capillary: 224 mg/dL — ABNORMAL HIGH (ref 70–99)
Glucose-Capillary: 244 mg/dL — ABNORMAL HIGH (ref 70–99)

## 2021-07-07 LAB — CBC
HCT: 44 % (ref 36.0–46.0)
HCT: 38.8 % (ref 36.0–46.0)
Hemoglobin: 13.8 g/dL (ref 12.0–15.0)
Hemoglobin: 12.4 g/dL (ref 12.0–15.0)
MCH: 22.9 pg — ABNORMAL LOW (ref 26.0–34.0)
MCH: 23.2 pg — ABNORMAL LOW (ref 26.0–34.0)
MCHC: 31.4 g/dL (ref 30.0–36.0)
MCHC: 32 g/dL (ref 30.0–36.0)
MCV: 73.1 fL — ABNORMAL LOW (ref 80.0–100.0)
MCV: 72.7 fL — ABNORMAL LOW (ref 80.0–100.0)
Platelets: 385 10*3/uL (ref 150–400)
Platelets: 367 10*3/uL (ref 150–400)
RBC: 6.02 MIL/uL — ABNORMAL HIGH (ref 3.87–5.11)
RBC: 5.34 MIL/uL — ABNORMAL HIGH (ref 3.87–5.11)
RDW: 16.5 % — ABNORMAL HIGH (ref 11.5–15.5)
RDW: 15.6 % — ABNORMAL HIGH (ref 11.5–15.5)
WBC: 5.1 10*3/uL (ref 4.0–10.5)
WBC: 16 10*3/uL — ABNORMAL HIGH (ref 4.0–10.5)
nRBC: 0 % (ref 0.0–0.2)
nRBC: 0 % (ref 0.0–0.2)

## 2021-07-07 LAB — HEMOGLOBIN A1C
Hgb A1c MFr Bld: 10.7 % — ABNORMAL HIGH (ref 4.8–5.6)
Mean Plasma Glucose: 260.39 mg/dL

## 2021-07-07 LAB — TYPE AND SCREEN
ABO/RH(D): O POS
Antibody Screen: NEGATIVE

## 2021-07-07 LAB — ABO/RH: ABO/RH(D): O POS

## 2021-07-07 SURGERY — SALPINGO-OOPHORECTOMY, LAPAROSCOPIC
Anesthesia: General | Laterality: Bilateral

## 2021-07-07 MED ORDER — LACTATED RINGERS IV SOLN
INTRAVENOUS | Status: DC
  Administered 2021-07-07: 1000 mL via INTRAVENOUS

## 2021-07-07 MED ORDER — ROCURONIUM BROMIDE 10 MG/ML (PF) SYRINGE
PREFILLED_SYRINGE | INTRAVENOUS | Status: AC
  Filled 2021-07-07: qty 10

## 2021-07-07 MED ORDER — SIMETHICONE 80 MG PO CHEW
80.0000 mg | CHEWABLE_TABLET | Freq: Four times a day (QID) | ORAL | Status: DC | PRN
  Administered 2021-07-07: 80 mg via ORAL

## 2021-07-07 MED ORDER — MONTELUKAST SODIUM 10 MG PO TABS
10.0000 mg | ORAL_TABLET | Freq: Every day | ORAL | Status: DC
  Administered 2021-07-07 – 2021-07-08 (×2): 10 mg via ORAL
  Filled 2021-07-07 (×2): qty 1

## 2021-07-07 MED ORDER — OXYCODONE HCL 5 MG PO TABS
ORAL_TABLET | ORAL | Status: AC
  Filled 2021-07-07: qty 1

## 2021-07-07 MED ORDER — MENTHOL 3 MG MT LOZG: 1.0000 | LOZENGE | OROMUCOSAL | Status: DC | PRN

## 2021-07-07 MED ORDER — FENTANYL CITRATE (PF) 100 MCG/2ML IJ SOLN
INTRAMUSCULAR | Status: AC
  Filled 2021-07-07: qty 2

## 2021-07-07 MED ORDER — DEXAMETHASONE SODIUM PHOSPHATE 10 MG/ML IJ SOLN
INTRAMUSCULAR | Status: AC
  Filled 2021-07-07: qty 1

## 2021-07-07 MED ORDER — IBUPROFEN 200 MG PO TABS
ORAL_TABLET | ORAL | Status: AC
  Filled 2021-07-07: qty 3

## 2021-07-07 MED ORDER — SODIUM CHLORIDE 0.9 % IR SOLN
Status: DC | PRN
  Administered 2021-07-07: 1000 mL

## 2021-07-07 MED ORDER — INSULIN ASPART 100 UNIT/ML IJ SOLN
4.0000 [IU] | Freq: Once | INTRAMUSCULAR | Status: AC
  Administered 2021-07-07: 4 [IU] via SUBCUTANEOUS

## 2021-07-07 MED ORDER — ACETAMINOPHEN 500 MG PO TABS
ORAL_TABLET | ORAL | Status: AC
  Filled 2021-07-07: qty 2

## 2021-07-07 MED ORDER — POVIDONE-IODINE 10 % EX SWAB: 2.0000 "application " | Freq: Once | CUTANEOUS | Status: DC

## 2021-07-07 MED ORDER — ONDANSETRON HCL 4 MG PO TABS: 4.0000 mg | ORAL_TABLET | Freq: Four times a day (QID) | ORAL | Status: DC | PRN

## 2021-07-07 MED ORDER — LIDOCAINE HCL (CARDIAC) PF 100 MG/5ML IV SOSY
PREFILLED_SYRINGE | INTRAVENOUS | Status: DC | PRN
  Administered 2021-07-07: 60 mg via INTRAVENOUS

## 2021-07-07 MED ORDER — ONDANSETRON HCL 4 MG/2ML IJ SOLN
INTRAMUSCULAR | Status: AC
  Filled 2021-07-07: qty 2

## 2021-07-07 MED ORDER — HYDROMORPHONE HCL 1 MG/ML IJ SOLN: 0.2000 mg | INTRAMUSCULAR | Status: DC | PRN

## 2021-07-07 MED ORDER — LACTATED RINGERS IV SOLN: INTRAVENOUS | Status: DC

## 2021-07-07 MED ORDER — BUPIVACAINE HCL 0.5 % IJ SOLN
INTRAMUSCULAR | Status: DC | PRN
  Administered 2021-07-07: 20 mL

## 2021-07-07 MED ORDER — SENNA 8.6 MG PO TABS
1.0000 | ORAL_TABLET | Freq: Two times a day (BID) | ORAL | Status: DC
  Administered 2021-07-07 – 2021-07-09 (×4): 8.6 mg via ORAL
  Filled 2021-07-07 (×2): qty 1

## 2021-07-07 MED ORDER — SENNA 8.6 MG PO TABS
ORAL_TABLET | ORAL | Status: AC
  Filled 2021-07-07: qty 1

## 2021-07-07 MED ORDER — ALUM & MAG HYDROXIDE-SIMETH 200-200-20 MG/5ML PO SUSP: 30.0000 mL | ORAL | Status: DC | PRN

## 2021-07-07 MED ORDER — ONDANSETRON HCL 4 MG/2ML IJ SOLN: 4.0000 mg | Freq: Four times a day (QID) | INTRAMUSCULAR | Status: DC | PRN

## 2021-07-07 MED ORDER — LEVOTHYROXINE SODIUM 50 MCG PO TABS
50.0000 ug | ORAL_TABLET | Freq: Every day | ORAL | Status: DC
  Administered 2021-07-07 – 2021-07-09 (×3): 50 ug via ORAL
  Filled 2021-07-07 (×3): qty 1

## 2021-07-07 MED ORDER — METFORMIN HCL ER 500 MG PO TB24
500.0000 mg | ORAL_TABLET | Freq: Every day | ORAL | Status: DC
  Administered 2021-07-07 – 2021-07-08 (×2): 500 mg via ORAL
  Filled 2021-07-07 (×3): qty 1

## 2021-07-07 MED ORDER — GLIMEPIRIDE 1 MG PO TABS
1.0000 mg | ORAL_TABLET | Freq: Every day | ORAL | Status: DC
  Administered 2021-07-07 – 2021-07-09 (×3): 1 mg via ORAL
  Filled 2021-07-07 (×3): qty 1

## 2021-07-07 MED ORDER — HYDROMORPHONE HCL 1 MG/ML IJ SOLN
INTRAMUSCULAR | Status: AC
  Filled 2021-07-07: qty 1

## 2021-07-07 MED ORDER — ONDANSETRON HCL 4 MG/2ML IJ SOLN
INTRAMUSCULAR | Status: DC | PRN
  Administered 2021-07-07: 4 mg via INTRAVENOUS

## 2021-07-07 MED ORDER — ACETAMINOPHEN 500 MG PO TABS
ORAL_TABLET | ORAL | Status: AC
Start: 2021-07-07 — End: ?
  Filled 2021-07-07: qty 2

## 2021-07-07 MED ORDER — CEFAZOLIN IN SODIUM CHLORIDE 3-0.9 GM/100ML-% IV SOLN: 3.0000 g | INTRAVENOUS | Status: DC

## 2021-07-07 MED ORDER — SUGAMMADEX SODIUM 200 MG/2ML IV SOLN
INTRAVENOUS | Status: DC | PRN
  Administered 2021-07-07: 200 mg via INTRAVENOUS

## 2021-07-07 MED ORDER — CEFAZOLIN SODIUM-DEXTROSE 2-4 GM/100ML-% IV SOLN
INTRAVENOUS | Status: AC
  Filled 2021-07-07: qty 100

## 2021-07-07 MED ORDER — ACETAMINOPHEN 500 MG PO TABS
1000.0000 mg | ORAL_TABLET | Freq: Once | ORAL | Status: AC
  Administered 2021-07-07: 1000 mg via ORAL

## 2021-07-07 MED ORDER — INSULIN ASPART 100 UNIT/ML IJ SOLN
0.0000 [IU] | INTRAMUSCULAR | Status: DC
  Administered 2021-07-07: 5 [IU] via SUBCUTANEOUS
  Administered 2021-07-08 (×3): 3 [IU] via SUBCUTANEOUS
  Administered 2021-07-08: 5 [IU] via SUBCUTANEOUS
  Administered 2021-07-08: 2 [IU] via SUBCUTANEOUS
  Administered 2021-07-08 – 2021-07-09 (×2): 5 [IU] via SUBCUTANEOUS

## 2021-07-07 MED ORDER — HYDROMORPHONE HCL 1 MG/ML IJ SOLN
0.2500 mg | INTRAMUSCULAR | Status: DC | PRN
  Administered 2021-07-07: 0.25 mg via INTRAVENOUS
  Administered 2021-07-07: 0.5 mg via INTRAVENOUS
  Administered 2021-07-07: 0.25 mg via INTRAVENOUS
  Administered 2021-07-07: 0.5 mg via INTRAVENOUS

## 2021-07-07 MED ORDER — METHOCARBAMOL 500 MG PO TABS
ORAL_TABLET | ORAL | Status: AC
  Filled 2021-07-07: qty 1

## 2021-07-07 MED ORDER — INSULIN ASPART 100 UNIT/ML IJ SOLN
INTRAMUSCULAR | Status: AC
  Filled 2021-07-07: qty 1

## 2021-07-07 MED ORDER — KETOROLAC TROMETHAMINE 30 MG/ML IJ SOLN
INTRAMUSCULAR | Status: DC | PRN
Start: 2021-07-07 — End: 2021-07-07
  Administered 2021-07-07: 30 mg via INTRAVENOUS

## 2021-07-07 MED ORDER — LIDOCAINE HCL (PF) 2 % IJ SOLN
INTRAMUSCULAR | Status: AC
  Filled 2021-07-07: qty 5

## 2021-07-07 MED ORDER — CEFAZOLIN SODIUM-DEXTROSE 2-4 GM/100ML-% IV SOLN
2.0000 g | INTRAVENOUS | Status: AC
  Administered 2021-07-07: 2 g via INTRAVENOUS

## 2021-07-07 MED ORDER — PROPOFOL 10 MG/ML IV BOLUS
INTRAVENOUS | Status: DC | PRN
  Administered 2021-07-07: 50 mg via INTRAVENOUS
  Administered 2021-07-07: 150 mg via INTRAVENOUS

## 2021-07-07 MED ORDER — SIMETHICONE 80 MG PO CHEW
CHEWABLE_TABLET | ORAL | Status: AC
  Filled 2021-07-07: qty 1

## 2021-07-07 MED ORDER — SENNOSIDES-DOCUSATE SODIUM 8.6-50 MG PO TABS
1.0000 | ORAL_TABLET | Freq: Every evening | ORAL | Status: DC | PRN
  Filled 2021-07-07: qty 1

## 2021-07-07 MED ORDER — METHOCARBAMOL 500 MG PO TABS
500.0000 mg | ORAL_TABLET | Freq: Four times a day (QID) | ORAL | Status: DC | PRN
  Administered 2021-07-07 – 2021-07-08 (×2): 500 mg via ORAL

## 2021-07-07 MED ORDER — ACETAMINOPHEN 500 MG PO TABS
1000.0000 mg | ORAL_TABLET | Freq: Four times a day (QID) | ORAL | Status: DC
  Administered 2021-07-07 – 2021-07-09 (×7): 1000 mg via ORAL
  Filled 2021-07-07 (×3): qty 2

## 2021-07-07 MED ORDER — PROPOFOL 10 MG/ML IV BOLUS
INTRAVENOUS | Status: AC
  Filled 2021-07-07: qty 20

## 2021-07-07 MED ORDER — IBUPROFEN 200 MG PO TABS
600.0000 mg | ORAL_TABLET | Freq: Four times a day (QID) | ORAL | Status: DC | PRN
  Administered 2021-07-07 – 2021-07-08 (×2): 600 mg via ORAL

## 2021-07-07 MED ORDER — MIDAZOLAM HCL 5 MG/5ML IJ SOLN
INTRAMUSCULAR | Status: DC | PRN
  Administered 2021-07-07: 2 mg via INTRAVENOUS

## 2021-07-07 MED ORDER — GLUCOSE BLOOD VI STRP: 1.0000 | ORAL_STRIP | Freq: Every day | Status: DC

## 2021-07-07 MED ORDER — DEXAMETHASONE SODIUM PHOSPHATE 4 MG/ML IJ SOLN
INTRAMUSCULAR | Status: DC | PRN
  Administered 2021-07-07: 10 mg via INTRAVENOUS

## 2021-07-07 MED ORDER — ROCURONIUM BROMIDE 100 MG/10ML IV SOLN
INTRAVENOUS | Status: DC | PRN
Start: 2021-07-07 — End: 2021-07-07
  Administered 2021-07-07: 50 mg via INTRAVENOUS
  Administered 2021-07-07: 10 mg via INTRAVENOUS

## 2021-07-07 MED ORDER — BUPIVACAINE LIPOSOME 1.3 % IJ SUSP
INTRAMUSCULAR | Status: DC | PRN
  Administered 2021-07-07: 20 mL

## 2021-07-07 MED ORDER — 0.9 % SODIUM CHLORIDE (POUR BTL) OPTIME
TOPICAL | Status: DC | PRN
  Administered 2021-07-07: 500 mL

## 2021-07-07 MED ORDER — OXYCODONE HCL 5 MG PO TABS
5.0000 mg | ORAL_TABLET | ORAL | Status: DC | PRN
  Administered 2021-07-07 (×3): 5 mg via ORAL
  Administered 2021-07-08: 10 mg via ORAL
  Administered 2021-07-08: 5 mg via ORAL
  Administered 2021-07-09: 03:00:00 10 mg via ORAL
  Administered 2021-07-09: 10:00:00 5 mg via ORAL
  Filled 2021-07-07 (×2): qty 2
  Filled 2021-07-07: qty 1

## 2021-07-07 MED ORDER — MIDAZOLAM HCL 2 MG/2ML IJ SOLN
INTRAMUSCULAR | Status: AC
  Filled 2021-07-07: qty 2

## 2021-07-07 MED ORDER — FENTANYL CITRATE (PF) 100 MCG/2ML IJ SOLN
INTRAMUSCULAR | Status: DC | PRN
  Administered 2021-07-07 (×4): 50 ug via INTRAVENOUS

## 2021-07-07 SURGICAL SUPPLY — 43 items
ADH SKN CLS APL DERMABOND .7 (GAUZE/BANDAGES/DRESSINGS) ×1
BENZOIN TINCTURE PRP APPL 2/3 (GAUZE/BANDAGES/DRESSINGS) ×1 IMPLANT
DERMABOND ADVANCED (GAUZE/BANDAGES/DRESSINGS) ×1
DERMABOND ADVANCED .7 DNX12 (GAUZE/BANDAGES/DRESSINGS) ×2 IMPLANT
DRSG OPSITE POSTOP 4X6 (GAUZE/BANDAGES/DRESSINGS) ×1 IMPLANT
DURAPREP 26ML APPLICATOR (WOUND CARE) ×3 IMPLANT
ELECT REM PT RETURN 9FT ADLT (ELECTROSURGICAL) ×3
ELECTRODE REM PT RTRN 9FT ADLT (ELECTROSURGICAL) IMPLANT
GAUZE 4X4 16PLY ~~LOC~~+RFID DBL (SPONGE) ×3 IMPLANT
GLOVE SURG ENC MOIS LTX SZ8 (GLOVE) ×6 IMPLANT
GLOVE SURG ORTHO LTX SZ8 (GLOVE) ×1 IMPLANT
GOWN STRL REUS W/TWL LRG LVL3 (GOWN DISPOSABLE) ×1 IMPLANT
GOWN STRL REUS W/TWL XL LVL3 (GOWN DISPOSABLE) ×3 IMPLANT
KIT TURNOVER CYSTO (KITS) ×3 IMPLANT
NEEDLE INSUFFLATION 120MM (ENDOMECHANICALS) ×3 IMPLANT
NS IRRIG 500ML POUR BTL (IV SOLUTION) ×3 IMPLANT
PACK LAPAROSCOPY BASIN (CUSTOM PROCEDURE TRAY) ×3 IMPLANT
PACK TRENDGUARD 450 HYBRID PRO (MISCELLANEOUS) ×2 IMPLANT
PAD OB MATERNITY 4.3X12.25 (PERSONAL CARE ITEMS) ×3 IMPLANT
PAD PREP 24X48 CUFFED NSTRL (MISCELLANEOUS) ×3 IMPLANT
POUCH SPECIMEN RETRIEVAL 10MM (ENDOMECHANICALS) IMPLANT
SCISSORS LAP 5X45 EPIX DISP (ENDOMECHANICALS) IMPLANT
SEALER TISSUE G2 CVD JAW 45CM (ENDOMECHANICALS) ×3 IMPLANT
SET TUBE SMOKE EVAC HIGH FLOW (TUBING) ×3 IMPLANT
SPONGE T-LAP 18X18 ~~LOC~~+RFID (SPONGE) ×2 IMPLANT
STRIP CLOSURE SKIN 1/2X4 (GAUZE/BANDAGES/DRESSINGS) ×1 IMPLANT
SUT MNCRL 0 MO-4 VIOLET 18 CR (SUTURE) IMPLANT
SUT MNCRL AB 0 CT1 27 (SUTURE) ×2 IMPLANT
SUT MONOCRYL 0 MO 4 18  CR/8 (SUTURE) ×3
SUT PDS AB 0 CTX 60 (SUTURE) ×2 IMPLANT
SUT VIC AB 4-0 KS 27 (SUTURE) ×1 IMPLANT
SUT VIC AB 4-0 PS2 27 (SUTURE) ×3 IMPLANT
SUT VICRYL 0 UR6 27IN ABS (SUTURE) ×3 IMPLANT
SYR 30ML LL (SYRINGE) ×3 IMPLANT
SYR BULB IRRIG 60ML STRL (SYRINGE) ×2 IMPLANT
TOWEL OR 17X26 10 PK STRL BLUE (TOWEL DISPOSABLE) ×3 IMPLANT
TRAY FOLEY W/BAG SLVR 14FR LF (SET/KITS/TRAYS/PACK) ×3 IMPLANT
TRENDGUARD 450 HYBRID PRO PACK (MISCELLANEOUS) ×3
TROCAR BLADELESS OPT 5 100 (ENDOMECHANICALS) ×4 IMPLANT
TROCAR XCEL NON-BLD 11X100MML (ENDOMECHANICALS) ×3 IMPLANT
TUBE CONNECTING 12X1/4 (SUCTIONS) ×1 IMPLANT
WARMER LAPAROSCOPE (MISCELLANEOUS) ×3 IMPLANT
YANKAUER SUCT BULB TIP NO VENT (SUCTIONS) ×1 IMPLANT

## 2021-07-07 NOTE — Progress Notes (Signed)
Tolerating some liquids. No N/V. Passed flatus x 1. Ambulating to BR and voiding well. C/O incisional some pain.  ? ?Today's Vitals  ? 07/07/21 1338 07/07/21 1500 07/07/21 1615 07/07/21 1700  ?BP:   (!) 151/82   ?Pulse:   (!) 109   ?Resp:   20   ?Temp:   98.9 ?F (37.2 ?C)   ?TempSrc:      ?SpO2:   98%   ?Weight:      ?Height:      ?PainSc: Asleep 5   7   ? ?Body mass index is 27.4 kg/m?.  ? ?Abdomen soft, BS good x 4 ?LE PAS on ? ?Cap glucose= 224 ? ?A/P: Pain management-will add ibuprofen 600mg  po q6 hrs prn and methocarbamol 500mg  po q6hrs prn ? ?        DM-She received her metformin and glimepiride about 3 pm. She also had dexamethasone this am. Will give 4 units Novalog now and check glucose in 2 hours. ?

## 2021-07-07 NOTE — Anesthesia Procedure Notes (Signed)
Procedure Name: Intubation ?Date/Time: 07/07/2021 7:36 AM ?Performed by: Justice Rocher, CRNA ?Pre-anesthesia Checklist: Patient identified, Emergency Drugs available, Suction available, Patient being monitored and Timeout performed ?Patient Re-evaluated:Patient Re-evaluated prior to induction ?Oxygen Delivery Method: Circle system utilized ?Preoxygenation: Pre-oxygenation with 100% oxygen ?Induction Type: IV induction ?Ventilation: Mask ventilation without difficulty ?Laryngoscope Size: Mac and 3 ?Grade View: Grade II ?Tube type: Oral ?Tube size: 7.0 mm ?Number of attempts: 1 ?Airway Equipment and Method: Stylet and Oral airway ?Placement Confirmation: ETT inserted through vocal cords under direct vision, positive ETCO2, breath sounds checked- equal and bilateral and CO2 detector ?Secured at: 22 cm ?Tube secured with: Tape ?Dental Injury: Teeth and Oropharynx as per pre-operative assessment  ? ? ? ? ?

## 2021-07-07 NOTE — Progress Notes (Signed)
07/07/2021 ? ?9:17 AM ? ?PATIENT:  Nicole Gallagher  61 y.o. female ? 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 ?PROCEDURE:  abdominal bilateral salpingo oophorectomy, diagnostic laparoscopy ? ?SURGEON:  Surgeon(s) and Role: ?   Harold Hedge, MD - Primary ?   Candice Camp, MD - Assisting ? ?PHYSICIAN ASSISTANT:  ? ?ASSISTANTS: Candice Camp MD  ? ?ANESTHESIA:   general ? ?EBL:  25 mL  ? ?BLOOD ADMINISTERED:none ? ?DRAINS: none  ? ?LOCAL MEDICATIONS USED:  MARCAINE   , Amount: 30 ml, and OTHER Exparel 20 ml ? ?SPECIMEN:  Source of Specimen:  bilateral tubes and ovaries ? ?DISPOSITION OF SPECIMEN:  PATHOLOGY ? ?COUNTS:  YES ? ?TOURNIQUET:  * No tourniquets in log * ? ?DICTATION: .Other Dictation: Dictation Number [id number] ? ?PLAN OF CARE: Admit for overnight observation ? ?PATIENT DISPOSITION:  PACU - hemodynamically stable. ?  ?Delay start of Pharmacological VTE agent (>24hrs) due to surgical blood loss or risk of bleeding: not applicable ? ?

## 2021-07-07 NOTE — Progress Notes (Signed)
No change to H&P per patient history ?Reviewed procedure-L/S BSO, possible laparotomy ?Post op instructions discussed ?She states she understands and agrees ?

## 2021-07-07 NOTE — Anesthesia Postprocedure Evaluation (Signed)
Anesthesia Post Note ? ?Patient: Nicole Gallagher ? ?Procedure(s) Performed: LAPAROSCOPIC BILATERAL SALPINGO OOPHORECTOMY (Bilateral) ? ?  ? ?Patient location during evaluation: PACU ?Anesthesia Type: General ?Level of consciousness: awake and alert ?Pain management: pain level controlled ?Vital Signs Assessment: post-procedure vital signs reviewed and stable ?Respiratory status: spontaneous breathing, nonlabored ventilation, respiratory function stable and patient connected to nasal cannula oxygen ?Cardiovascular status: blood pressure returned to baseline and stable ?Postop Assessment: no apparent nausea or vomiting ?Anesthetic complications: no ? ? ?No notable events documented. ? ?Last Vitals:  ?Vitals:  ? 07/07/21 1045 07/07/21 1115  ?BP: (!) 154/86 (!) 158/84  ?Pulse: 96 98  ?Resp: 16 16  ?Temp: 36.6 ?C 36.7 ?C  ?SpO2: 97% 97%  ?  ?Last Pain:  ?Vitals:  ? 07/07/21 1115  ?TempSrc:   ?PainSc: Asleep  ? ? ?  ?  ?  ?  ?  ?  ? ?Nicole Gallagher,W. EDMOND ? ? ? ? ?

## 2021-07-07 NOTE — Progress Notes (Signed)
Tc to Dr. Henderson Cloud to notify of 2000 CBG reading of 244. Orders placed for SSI and simethicone d/t c/o gas. Also discussed pt's tachycardia. Pt denies chest pain/SOB, scant vaginal bleeding to peripad, abdomen soft, abdominal incisions C/D/I with scant drng marked. All other VSS. Orders placed for stat CBC. Will continue to monitor.  ?

## 2021-07-07 NOTE — Op Note (Signed)
NAME: Nicole Gallagher, Nicole Gallagher ?MEDICAL RECORD NO: [id number] ?ACCOUNT NO: [id number] ?DATE OF BIRTH: Feb 13, 1961 ?FACILITY: [facility] ?LOCATION: [facility] ?PHYSICIAN: Guy Sandifer. Arleta Creek, MD ? ?Operative Report  ? ?DATE OF PROCEDURE: 07/07/2021 ? ? ?PREOPERATIVE DIAGNOSIS:  Family history of ovarian cancer. ? ?POSTOPERATIVE DIAGNOSIS:   ?1.  Family history of ovarian cancer. ?2.  Pelvic adhesions. ? ?PROCEDURE:  Abdominal bilateral salpingo-oophorectomy and diagnostic laparoscopy. ? ?SURGEON:  Harold Hedge II, MD ? ?ASSISTANT:  Candice Camp, M.D. ? ?ANESTHESIA:  General with endotracheal intubation.  Sampson Goon, M.D. ? ?ESTIMATED BLOOD LOSS:  25 mL ? ?SPECIMENS:  Bilateral fallopian tubes and ovaries to pathology. ? ?INDICATIONS AND CONSENT:  This patient is a 61 year old patient status post LAVH whose mother had ovarian cancer in her 30s.  She requests removal of her ovaries.  Laparoscopy with bilateral salpingo-oophorectomy, possible laparotomy has been discussed  ?preoperatively.  Potential risks and complications were discussed preoperatively including but not limited to infection, organ damage, bleeding requiring transfusion of blood products with HIV and hepatitis acquisition, DVT, PE, pneumonia, pelvic pain.   ?The patient states she understands and agrees and consent is signed on the chart. ? ?FINDINGS:  There are omental adhesions immediately superior to the umbilical incision that contained no loops of bowel.  In the pelvis, the uterus has been removed.  The right tube and ovary were normal.  There were some filmy adhesions of the right  ?ovary to the pelvic sidewall.  The left ovary was more densely adherent to the left pelvic sidewall and it is difficult to visualize behind the sigmoid. ? ?DESCRIPTION OF PROCEDURE:  The patient was taken to the operating room where she was identified, placed in dorsal supine position and general anesthesia was induced via endotracheal intubation.  She was placed in the dorsal lithotomy  position.  She was  ?prepped vaginally with Betadine.  Foley catheter was placed and she was prepped abdominally with DuraPrep.  Timeout undertaken.  After 3-minute drying time, she was draped in a sterile fashion.  The infraumbilical and suprapubic areas were injected with  ?10 mL of 0.5% plain Marcaine.  An infraumbilical incision was made.  Disposable Veress needle was placed on the first attempt without difficulty.  Good syringe and drop tests were noted.  Two liters of gas were insufflated under low pressure with good  ?tympany in the right upper quadrant.  Veress needle was removed and a 10/11 Xcel bladeless disposable trocar sleeve was placed using direct visualization with the diagnostic scope.  The operative scope was then used.  A small suprapubic incision was made ? in the midline and a 5 mm trocar sleeve was placed under direct visualization without difficulty.  As the above findings were noted to facilitate with exposure, a third small incision was made in the left lower quadrant and an additional 5 mm trocar  ?sleeve is placed under direct visualization without difficulty.  After noting the above findings, the decision was made to proceed with a laparotomy.  The instruments and trocar sleeves were removed.  Foley catheter was already in place.  A small  ?Pfannenstiel incision was made and dissection was carried out in layers to the peritoneum, which was extended superiorly and inferiorly.  Wet packs were placed and the left tube and ovary are elevated and carefully dissected with sharp dissection.  This  ?elevates the infundibulopelvic ligament and the ovary well above the left pelvic sidewall.  Curved Heaney clamps were placed beneath the ovary and it is completely resected.  Pedicles are tied with 0 Monocryl suture.  The infundibulopelvic ligament is  ?doubly ligated with a free tie as well.  The right ovary was elevated.  The filmy adhesions is clamped, taken down and delivered as well.  Again, the  infundibulopelvic ligament is doubly ligated with a suture and a free tie.  Lavage was carried out.   ?Careful inspection reveals good hemostasis all around.  Packs were removed.  Counts were correct and the anterior peritoneum was closed in a running fashion with 0 Monocryl suture, which was also used to reapproximate the pyramidalis muscle in the  ?midline.  The anterior rectus fascia was closed in running fashion with a 0 looped PDS.  At this point, 30 mL of 0.5% plain Marcaine in 20 mL of Exparel was injected both subfascially and subcutaneously.  About 40 mL of the solution are used.  Skin was  ?closed in a subcuticular fashion with 4-0 Vicryl on a Keith needle.  The umbilical incision was closed with interrupted 2-0 Vicryl.  Dermabond is placed on the umbilical incision as well as on the small suprapubic incision, which is just above the  ?Pfannenstiel.  Benzoin and Steri-Strips were placed over the Pfannenstiel incision.  All counts were correct.  The patient was awakened and taken to recovery room in stable condition.  ? ? ? ?SUJ ?D: 07/07/2021 9:27:33 am T: 07/07/2021 10:02:00 am  ?JOB: [id number]/ [id number]  ?

## 2021-07-07 NOTE — Transfer of Care (Signed)
Immediate Anesthesia Transfer of Care Note ? ?Patient: Nicole Gallagher ? ?Procedure(s) Performed: Procedure(s) (LRB): ?LAPAROSCOPIC BILATERAL SALPINGO OOPHORECTOMY (Bilateral) ? ?Patient Location: PACU ? ?Anesthesia Type: General ? ?Level of Consciousness: awake, sedated, patient cooperative and responds to stimulation ? ?Airway & Oxygen Therapy: Patient Spontanous Breathing and Patient connected to [place] 02 and soft FM  ? ?Post-op Assessment: Report given to PACU RN, Post -op Vital signs reviewed and stable and Patient moving all extremities ? ?Post vital signs: Reviewed and stable ? ?Complications: No apparent anesthesia complications ?

## 2021-07-08 ENCOUNTER — Other Ambulatory Visit: Payer: Self-pay

## 2021-07-08 ENCOUNTER — Observation Stay (HOSPITAL_COMMUNITY)
Admission: RE | Admit: 2021-07-08 | Discharge: 2021-07-08 | Disposition: A | Discharge status: Home / Self Care | Payer: BC Managed Care – PPO | Source: Home / Self Care | Attending: Internal Medicine | Admitting: Internal Medicine

## 2021-07-08 ENCOUNTER — Encounter (HOSPITAL_BASED_OUTPATIENT_CLINIC_OR_DEPARTMENT_OTHER): Payer: Self-pay | Admitting: Obstetrics and Gynecology

## 2021-07-08 DIAGNOSIS — Z79899 Other long term (current) drug therapy: Secondary | ICD-10-CM | POA: Diagnosis not present

## 2021-07-08 DIAGNOSIS — Z20822 Contact with and (suspected) exposure to covid-19: Secondary | ICD-10-CM | POA: Diagnosis present

## 2021-07-08 DIAGNOSIS — Z8249 Family history of ischemic heart disease and other diseases of the circulatory system: Secondary | ICD-10-CM | POA: Diagnosis not present

## 2021-07-08 DIAGNOSIS — K660 Peritoneal adhesions (postprocedural) (postinfection): Secondary | ICD-10-CM | POA: Diagnosis present

## 2021-07-08 DIAGNOSIS — I2510 Atherosclerotic heart disease of native coronary artery without angina pectoris: Secondary | ICD-10-CM | POA: Diagnosis present

## 2021-07-08 DIAGNOSIS — D649 Anemia, unspecified: Secondary | ICD-10-CM

## 2021-07-08 DIAGNOSIS — R000 Tachycardia, unspecified: Secondary | ICD-10-CM | POA: Diagnosis present

## 2021-07-08 DIAGNOSIS — R109 Unspecified abdominal pain: Secondary | ICD-10-CM | POA: Diagnosis not present

## 2021-07-08 DIAGNOSIS — Z8041 Family history of malignant neoplasm of ovary: Secondary | ICD-10-CM | POA: Diagnosis not present

## 2021-07-08 DIAGNOSIS — Z7984 Long term (current) use of oral hypoglycemic drugs: Secondary | ICD-10-CM | POA: Diagnosis not present

## 2021-07-08 DIAGNOSIS — N99840 Postprocedural hematoma of a genitourinary system organ or structure following a genitourinary system procedure: Secondary | ICD-10-CM | POA: Diagnosis present

## 2021-07-08 DIAGNOSIS — I700 Atherosclerosis of aorta: Secondary | ICD-10-CM | POA: Diagnosis present

## 2021-07-08 DIAGNOSIS — Z79890 Hormone replacement therapy (postmenopausal): Secondary | ICD-10-CM | POA: Diagnosis not present

## 2021-07-08 DIAGNOSIS — E785 Hyperlipidemia, unspecified: Secondary | ICD-10-CM | POA: Diagnosis present

## 2021-07-08 DIAGNOSIS — Z833 Family history of diabetes mellitus: Secondary | ICD-10-CM | POA: Diagnosis not present

## 2021-07-08 DIAGNOSIS — E1165 Type 2 diabetes mellitus with hyperglycemia: Secondary | ICD-10-CM | POA: Diagnosis not present

## 2021-07-08 DIAGNOSIS — E1169 Type 2 diabetes mellitus with other specified complication: Secondary | ICD-10-CM | POA: Diagnosis present

## 2021-07-08 DIAGNOSIS — D62 Acute posthemorrhagic anemia: Secondary | ICD-10-CM | POA: Diagnosis present

## 2021-07-08 DIAGNOSIS — Z4002 Encounter for prophylactic removal of ovary: Secondary | ICD-10-CM | POA: Diagnosis present

## 2021-07-08 DIAGNOSIS — E118 Type 2 diabetes mellitus with unspecified complications: Secondary | ICD-10-CM | POA: Diagnosis not present

## 2021-07-08 DIAGNOSIS — N736 Female pelvic peritoneal adhesions (postinfective): Secondary | ICD-10-CM | POA: Diagnosis present

## 2021-07-08 DIAGNOSIS — E039 Hypothyroidism, unspecified: Secondary | ICD-10-CM | POA: Diagnosis present

## 2021-07-08 LAB — RESP PANEL BY RT-PCR (FLU A&B, COVID) ARPGX2
Influenza A by PCR: NEGATIVE
Influenza B by PCR: NEGATIVE
SARS Coronavirus 2 by RT PCR: NEGATIVE

## 2021-07-08 LAB — CBC
HCT: 36.1 % (ref 36.0–46.0)
HCT: 33.2 % — ABNORMAL LOW (ref 36.0–46.0)
HCT: 33.8 % — ABNORMAL LOW (ref 36.0–46.0)
Hemoglobin: 11.3 g/dL — ABNORMAL LOW (ref 12.0–15.0)
Hemoglobin: 10.4 g/dL — ABNORMAL LOW (ref 12.0–15.0)
Hemoglobin: 10.8 g/dL — ABNORMAL LOW (ref 12.0–15.0)
MCH: 23.1 pg — ABNORMAL LOW (ref 26.0–34.0)
MCH: 22.9 pg — ABNORMAL LOW (ref 26.0–34.0)
MCH: 23.2 pg — ABNORMAL LOW (ref 26.0–34.0)
MCHC: 31.3 g/dL (ref 30.0–36.0)
MCHC: 31.3 g/dL (ref 30.0–36.0)
MCHC: 32 g/dL (ref 30.0–36.0)
MCV: 73.8 fL — ABNORMAL LOW (ref 80.0–100.0)
MCV: 73.1 fL — ABNORMAL LOW (ref 80.0–100.0)
MCV: 72.5 fL — ABNORMAL LOW (ref 80.0–100.0)
Platelets: 359 10*3/uL (ref 150–400)
Platelets: 323 10*3/uL (ref 150–400)
Platelets: 330 10*3/uL (ref 150–400)
RBC: 4.89 MIL/uL (ref 3.87–5.11)
RBC: 4.54 MIL/uL (ref 3.87–5.11)
RBC: 4.66 MIL/uL (ref 3.87–5.11)
RDW: 15.8 % — ABNORMAL HIGH (ref 11.5–15.5)
RDW: 15.9 % — ABNORMAL HIGH (ref 11.5–15.5)
RDW: 15.8 % — ABNORMAL HIGH (ref 11.5–15.5)
WBC: 12.2 10*3/uL — ABNORMAL HIGH (ref 4.0–10.5)
WBC: 9.9 10*3/uL (ref 4.0–10.5)
WBC: 9.5 10*3/uL (ref 4.0–10.5)
nRBC: 0 % (ref 0.0–0.2)
nRBC: 0 % (ref 0.0–0.2)
nRBC: 0 % (ref 0.0–0.2)

## 2021-07-08 LAB — GLUCOSE, CAPILLARY
Glucose-Capillary: 136 mg/dL — ABNORMAL HIGH (ref 70–99)
Glucose-Capillary: 161 mg/dL — ABNORMAL HIGH (ref 70–99)
Glucose-Capillary: 167 mg/dL — ABNORMAL HIGH (ref 70–99)
Glucose-Capillary: 193 mg/dL — ABNORMAL HIGH (ref 70–99)
Glucose-Capillary: 208 mg/dL — ABNORMAL HIGH (ref 70–99)
Glucose-Capillary: 240 mg/dL — ABNORMAL HIGH (ref 70–99)

## 2021-07-08 LAB — SURGICAL PATHOLOGY

## 2021-07-08 MED ORDER — ACETAMINOPHEN 500 MG PO TABS
ORAL_TABLET | ORAL | Status: AC
  Filled 2021-07-08: qty 2

## 2021-07-08 MED ORDER — PANTOPRAZOLE SODIUM 40 MG PO TBEC
40.0000 mg | DELAYED_RELEASE_TABLET | Freq: Every day | ORAL | Status: DC
  Administered 2021-07-08 – 2021-07-09 (×2): 40 mg via ORAL
  Filled 2021-07-08 (×2): qty 1

## 2021-07-08 MED ORDER — IBUPROFEN 200 MG PO TABS
ORAL_TABLET | ORAL | Status: AC
  Filled 2021-07-08: qty 3

## 2021-07-08 MED ORDER — METHOCARBAMOL 500 MG PO TABS
ORAL_TABLET | ORAL | Status: AC
  Filled 2021-07-08: qty 1

## 2021-07-08 MED ORDER — LACTATED RINGERS IV BOLUS
500.0000 mL | Freq: Once | INTRAVENOUS | Status: AC
  Administered 2021-07-08: 500 mL via INTRAVENOUS

## 2021-07-08 MED ORDER — SENNA 8.6 MG PO TABS
ORAL_TABLET | ORAL | Status: AC
  Filled 2021-07-08: qty 1

## 2021-07-08 MED ORDER — OXYCODONE HCL 5 MG PO TABS
ORAL_TABLET | ORAL | Status: AC
  Filled 2021-07-08: qty 1

## 2021-07-08 MED ORDER — INSULIN ASPART 100 UNIT/ML IJ SOLN
INTRAMUSCULAR | Status: AC
  Filled 2021-07-08: qty 1

## 2021-07-08 MED ORDER — INFLUENZA VAC SPLIT QUAD 0.5 ML IM SUSY
0.5000 mL | PREFILLED_SYRINGE | INTRAMUSCULAR | Status: DC
Start: 2021-07-09 — End: 2021-07-09
  Filled 2021-07-08: qty 0.5

## 2021-07-08 NOTE — Progress Notes (Signed)
She feels much more comfortable this am. Ambulating to BR without dizziness,tolerating regular diet, voiding. ? ?Today's Vitals  ? 07/08/21 0014 07/08/21 0200 07/08/21 0424 07/08/21 0615  ?BP: 137/71  (!) 127/58   ?Pulse: (!) 110  (!) 109   ?Resp: (!) 24  18   ?Temp: 99.1 ?F (37.3 ?C)  99.2 ?F (37.3 ?C)   ?TempSrc:      ?SpO2: 96%  97%   ?Weight:      ?Height:      ?PainSc: 2  Asleep 1  1   ? ?Body mass index is 27.4 kg/m?.  ? ?Lungs CTA ?Corr RR 104 ?Abdomen soft, BS pos x 4 ?Dressing dry ?LE PAS on ? ?Results for orders placed or performed during the hospital encounter of 07/07/21 (from the past 24 hour(s))  ?Glucose, capillary     Status: Abnormal  ? Collection Time: 07/07/21  7:56 AM  ?Result Value Ref Range  ? Glucose-Capillary 241 (H) 70 - 99 mg/dL  ?Glucose, capillary     Status: Abnormal  ? Collection Time: 07/07/21  9:39 AM  ?Result Value Ref Range  ? Glucose-Capillary 232 (H) 70 - 99 mg/dL  ?Glucose, capillary     Status: Abnormal  ? Collection Time: 07/07/21  1:25 PM  ?Result Value Ref Range  ? Glucose-Capillary 249 (H) 70 - 99 mg/dL  ?Glucose, capillary     Status: Abnormal  ? Collection Time: 07/07/21  6:03 PM  ?Result Value Ref Range  ? Glucose-Capillary 224 (H) 70 - 99 mg/dL  ?Glucose, capillary     Status: Abnormal  ? Collection Time: 07/07/21  7:56 PM  ?Result Value Ref Range  ? Glucose-Capillary 244 (H) 70 - 99 mg/dL  ?CBC     Status: Abnormal  ? Collection Time: 07/07/21  8:16 PM  ?Result Value Ref Range  ? WBC 16.0 (H) 4.0 - 10.5 K/uL  ? RBC 5.34 (H) 3.87 - 5.11 MIL/uL  ? Hemoglobin 12.4 12.0 - 15.0 g/dL  ? HCT 38.8 36.0 - 46.0 %  ? MCV 72.7 (L) 80.0 - 100.0 fL  ? MCH 23.2 (L) 26.0 - 34.0 pg  ? MCHC 32.0 30.0 - 36.0 g/dL  ? RDW 15.6 (H) 11.5 - 15.5 %  ? Platelets 367 150 - 400 K/uL  ? nRBC 0.0 0.0 - 0.2 %  ?Hemoglobin A1c     Status: Abnormal  ? Collection Time: 07/07/21  8:30 PM  ?Result Value Ref Range  ? Hgb A1c MFr Bld 10.7 (H) 4.8 - 5.6 %  ? Mean Plasma Glucose 260.39 mg/dL  ?Glucose,  capillary     Status: Abnormal  ? Collection Time: 07/08/21 12:13 AM  ?Result Value Ref Range  ? Glucose-Capillary 240 (H) 70 - 99 mg/dL  ?Glucose, capillary     Status: Abnormal  ? Collection Time: 07/08/21  4:28 AM  ?Result Value Ref Range  ? Glucose-Capillary 208 (H) 70 - 99 mg/dL  ?CBC     Status: Abnormal  ? Collection Time: 07/08/21  6:25 AM  ?Result Value Ref Range  ? WBC 12.2 (H) 4.0 - 10.5 K/uL  ? RBC 4.89 3.87 - 5.11 MIL/uL  ? Hemoglobin 11.3 (L) 12.0 - 15.0 g/dL  ? HCT 36.1 36.0 - 46.0 %  ? MCV 73.8 (L) 80.0 - 100.0 fL  ? MCH 23.1 (L) 26.0 - 34.0 pg  ? MCHC 31.3 30.0 - 36.0 g/dL  ? RDW 15.8 (H) 11.5 - 15.5 %  ? Platelets 359 150 - 400  K/uL  ? nRBC 0.0 0.0 - 0.2 %  ?  ?A/P: Post op anemia and tachycardia ?           Clinically she is recovering well. Hgb this am is 11.3 and she has persistent tachycardia 90-110 (120 last pm). ?           D/W need to R/O postoperative bleeding with patient and husband. All questions answered. She states she understands and agrees. ?           D/W radiologist>Abd/pelvic CT without contrast ?           Repeat CBC @ 1000.  ?           Continue IV fluids and clear liquids only ?

## 2021-07-08 NOTE — Progress Notes (Signed)
Up to BR without dizziness, voiding. Good pain relief. No N/V. ? ?Today's Vitals  ? 07/08/21 0917 07/08/21 1214 07/08/21 1517 07/08/21 1703  ?BP: 139/85 (!) 153/80 (!) 149/84   ?Pulse: (!) 102 (!) 107 (!) 115   ?Resp: 18  18   ?Temp: 98.2 ?F (36.8 ?C) 98 ?F (36.7 ?C) 98.6 ?F (37 ?C)   ?TempSrc:   Oral   ?SpO2: 97% 98% 98%   ?Weight:      ?Height:      ?PainSc: 1  0-No pain 1  4   ? ?Body mass index is 27.4 kg/m?Marland Kitchen  ?Results for orders placed or performed during the hospital encounter of 07/07/21 (from the past 24 hour(s))  ?Glucose, capillary     Status: Abnormal  ? Collection Time: 07/07/21  6:03 PM  ?Result Value Ref Range  ? Glucose-Capillary 224 (H) 70 - 99 mg/dL  ?Glucose, capillary     Status: Abnormal  ? Collection Time: 07/07/21  7:56 PM  ?Result Value Ref Range  ? Glucose-Capillary 244 (H) 70 - 99 mg/dL  ?CBC     Status: Abnormal  ? Collection Time: 07/07/21  8:16 PM  ?Result Value Ref Range  ? WBC 16.0 (H) 4.0 - 10.5 K/uL  ? RBC 5.34 (H) 3.87 - 5.11 MIL/uL  ? Hemoglobin 12.4 12.0 - 15.0 g/dL  ? HCT 38.8 36.0 - 46.0 %  ? MCV 72.7 (L) 80.0 - 100.0 fL  ? MCH 23.2 (L) 26.0 - 34.0 pg  ? MCHC 32.0 30.0 - 36.0 g/dL  ? RDW 15.6 (H) 11.5 - 15.5 %  ? Platelets 367 150 - 400 K/uL  ? nRBC 0.0 0.0 - 0.2 %  ?Hemoglobin A1c     Status: Abnormal  ? Collection Time: 07/07/21  8:30 PM  ?Result Value Ref Range  ? Hgb A1c MFr Bld 10.7 (H) 4.8 - 5.6 %  ? Mean Plasma Glucose 260.39 mg/dL  ?Glucose, capillary     Status: Abnormal  ? Collection Time: 07/08/21 12:13 AM  ?Result Value Ref Range  ? Glucose-Capillary 240 (H) 70 - 99 mg/dL  ?Glucose, capillary     Status: Abnormal  ? Collection Time: 07/08/21  4:28 AM  ?Result Value Ref Range  ? Glucose-Capillary 208 (H) 70 - 99 mg/dL  ?CBC     Status: Abnormal  ? Collection Time: 07/08/21  6:25 AM  ?Result Value Ref Range  ? WBC 12.2 (H) 4.0 - 10.5 K/uL  ? RBC 4.89 3.87 - 5.11 MIL/uL  ? Hemoglobin 11.3 (L) 12.0 - 15.0 g/dL  ? HCT 36.1 36.0 - 46.0 %  ? MCV 73.8 (L) 80.0 - 100.0 fL  ?  MCH 23.1 (L) 26.0 - 34.0 pg  ? MCHC 31.3 30.0 - 36.0 g/dL  ? RDW 15.8 (H) 11.5 - 15.5 %  ? Platelets 359 150 - 400 K/uL  ? nRBC 0.0 0.0 - 0.2 %  ?Glucose, capillary     Status: Abnormal  ? Collection Time: 07/08/21  8:12 AM  ?Result Value Ref Range  ? Glucose-Capillary 136 (H) 70 - 99 mg/dL  ?CBC     Status: Abnormal  ? Collection Time: 07/08/21  9:32 AM  ?Result Value Ref Range  ? WBC 9.9 4.0 - 10.5 K/uL  ? RBC 4.54 3.87 - 5.11 MIL/uL  ? Hemoglobin 10.4 (L) 12.0 - 15.0 g/dL  ? HCT 33.2 (L) 36.0 - 46.0 %  ? MCV 73.1 (L) 80.0 - 100.0 fL  ? MCH 22.9 (L) 26.0 -  34.0 pg  ? MCHC 31.3 30.0 - 36.0 g/dL  ? RDW 15.9 (H) 11.5 - 15.5 %  ? Platelets 323 150 - 400 K/uL  ? nRBC 0.0 0.0 - 0.2 %  ?Resp Panel by RT-PCR (Flu A&B, Covid) Nasopharyngeal Swab     Status: None  ? Collection Time: 07/08/21  9:52 AM  ? Specimen: Nasopharyngeal Swab; Nasopharyngeal(NP) swabs in vial transport medium  ?Result Value Ref Range  ? SARS Coronavirus 2 by RT PCR NEGATIVE NEGATIVE  ? Influenza A by PCR NEGATIVE NEGATIVE  ? Influenza B by PCR NEGATIVE NEGATIVE  ?CBC     Status: Abnormal  ? Collection Time: 07/08/21  1:49 PM  ?Result Value Ref Range  ? WBC 9.5 4.0 - 10.5 K/uL  ? RBC 4.66 3.87 - 5.11 MIL/uL  ? Hemoglobin 10.8 (L) 12.0 - 15.0 g/dL  ? HCT 33.8 (L) 36.0 - 46.0 %  ? MCV 72.5 (L) 80.0 - 100.0 fL  ? MCH 23.2 (L) 26.0 - 34.0 pg  ? MCHC 32.0 30.0 - 36.0 g/dL  ? RDW 15.8 (H) 11.5 - 15.5 %  ? Platelets 330 150 - 400 K/uL  ? nRBC 0.0 0.0 - 0.2 %  ?Glucose, capillary     Status: Abnormal  ? Collection Time: 07/08/21  3:27 PM  ?Result Value Ref Range  ? Glucose-Capillary 167 (H) 70 - 99 mg/dL  ?  ?A/P: Post Op-doing well, good return of bowel function and good pain control ?        Anemia-Hgb now stable @ 10.8.  ?                    -CT negative for intra abdominal hematoma ?        Appreciate assistance of Dr Robb Matar and team ?        D/W above with patient and her husband. Orders per hospital team. ?

## 2021-07-08 NOTE — Progress Notes (Signed)
Pt transported on portable tele monitor to room 1411. HR remained stable between 102-117 during transport. Marchelle Folks, RN on 4E received pt.  ?

## 2021-07-08 NOTE — Progress Notes (Signed)
Tc to Marchelle Folks, RN on [address], report given on pt. Family and Dr. Henderson Cloud notified of room # for inpatient admission. Pt currently on portable telemetry.  ?

## 2021-07-08 NOTE — H&P (Signed)
?History and Physical  ? ? ?Patient: Nicole Gallagher NAT:[id number] DOB: 1960-05-31 ?DOA: 07/07/2021 ?DOS: the patient was seen and examined on 07/08/2021 ?PCP: Myrlene Broker, MD  ?Patient coming from: Home ? ?Chief Complaint: No chief complaint on file. ? ?HPI: Nicole Gallagher is a 61 y.o. female with medical history significant of seasonal allergies, type 2 diabetes, hypothyroidism, multinodular goiter, hyperlipidemia who underwent laparoscopic bilateral salpingo-oophorectomy yesterday and we are due to persistent tachycardia and acute blood loss anemia.  She has some mild diffuse incisional pain, but denied nausea, emesis, diarrhea, constipation, melena or hematochezia.  She was able to tolerate diet this morning.  She has been voiding without discomfort.  No fever, chills, sore throat, rhinorrhea, dyspnea, chest pain, palpitations, diaphoresis, PND, orthopnea or pitting edema of the lower extremities.  Denied flank pain, dysuria, frequency or hematuria.  No polyuria, polydipsia, polyphagia or blurred vision. ? ?Preop and postop course: Initial vital signs were temperature 97.1 ?F, pulse 102, respirations 16, BP 140/82 mmHg and O2 sat 99% on room air.  Most recent O2 sat is 98% on room air. ? ?Lab work: Initial hemoglobin was 13.8 yesterday and most recent level was 10.8 g/dL.  Postop WBC count was 16,000, but now is 9.5.  Platelets have been normal. ? ?Imaging: CT abdomen/pelvis with contrast showed 15 cm hematoma along with scattered gas locules in the space of Retzius.  No large hematoma is identified in the pelvis to account for the patient's anemia.  There are small loculations of gas along the limited pfannenstiel incision and along the infraumbilical trocar site, likely incidental postop findings.  Small single bilateral nonobstructive renal calculi.  Aortic atherosclerosis.  Right coronary arthrosclerosis.  Linear subsegmental atelectasis or scarring in both lower lobes.  Please see images and full  radiology report for further details. ?  ?Review of Systems: As mentioned in the history of present illness. All other systems reviewed and are negative. ?Past Medical History:  ?Diagnosis Date  ? Allergy   ? Diabetes mellitus without complication (HCC)   ? Hypothyroidism   ? ?Past Surgical History:  ?Procedure Laterality Date  ? ABDOMINAL HYSTERECTOMY  11/2006  ? NASAL SINUS SURGERY    ? ?Social History:  reports that she has never smoked. She has never used smokeless tobacco. She reports that she does not drink alcohol and does not use drugs. ? ?No Known Allergies ? ?Family History  ?Problem Relation Age of Onset  ? Cancer Mother   ?     cervical  ? Hypertension Father   ? Hyperlipidemia Father   ? Hypertension Maternal Aunt   ? Diabetes Maternal Aunt   ? Hypertension Maternal Uncle   ? Diabetes Maternal Uncle   ? Hypertension Paternal Aunt   ? Diabetes Paternal Aunt   ? Hypertension Paternal Uncle   ? Diabetes Paternal Uncle   ? Hypertension Maternal Grandmother   ? Diabetes Maternal Grandmother   ? Hypertension Maternal Grandfather   ? Diabetes Maternal Grandfather   ? Hypertension Paternal Grandmother   ? Diabetes Paternal Grandmother   ? Hypertension Paternal Grandfather   ? Diabetes Paternal Grandfather   ? ? ?Prior to Admission medications   ?Medication Sig Start Date End Date Taking? Authorizing Provider  ?glimepiride (AMARYL) 1 MG tablet Take 1 tablet (1 mg total) by mouth daily with breakfast. 05/05/21  Yes Myrlene Broker, MD  ?glucose blood (ACCU-CHEK GUIDE) test strip 1 each by Other route daily. And lancets 1/day 03/04/16  Yes Romero Belling, MD  ?  levothyroxine (SYNTHROID) 50 MCG tablet Take 1 tablet (50 mcg total) by mouth daily. 02/13/21  Yes Myrlene Brokerrawford, Elizabeth A, MD  ?metFORMIN (GLUCOPHAGE XR) 500 MG 24 hr tablet Take 1 tablet (500 mg total) by mouth daily with breakfast. 05/05/21  Yes Myrlene Brokerrawford, Elizabeth A, MD  ?montelukast (SINGULAIR) 10 MG tablet Take 1 tablet (10 mg total) by mouth at bedtime.  01/07/21  Yes Myrlene Brokerrawford, Elizabeth A, MD  ?simvastatin (ZOCOR) 20 MG tablet Take 1 tablet (20 mg total) by mouth at bedtime. 02/05/21  Yes Myrlene Brokerrawford, Elizabeth A, MD  ? ? ?Physical Exam: ?Vitals:  ? 07/08/21 0800 07/08/21 0917 07/08/21 1214 07/08/21 1517  ?BP: 131/60 139/85 (!) 153/80 (!) 149/84  ?Pulse: (!) 111 (!) 102 (!) 107 (!) 115  ?Resp: (!) 26 18  18   ?Temp: 98.5 ?F (36.9 ?C) 98.2 ?F (36.8 ?C) 98 ?F (36.7 ?C) 98.6 ?F (37 ?C)  ?TempSrc:    Oral  ?SpO2: 95% 97% 98% 98%  ?Weight:      ?Height:      ? ?Physical Exam ?Vitals and nursing note reviewed.  ?Constitutional:   ?   Appearance: Normal appearance.  ?HENT:  ?   Head: Normocephalic.  ?   Mouth/Throat:  ?   Mouth: Mucous membranes are moist.  ?Eyes:  ?   General: No scleral icterus. ?   Pupils: Pupils are equal, round, and reactive to light.  ?Neck:  ?   Vascular: No JVD.  ?Cardiovascular:  ?   Rate and Rhythm: Regular rhythm. Tachycardia present.  ?Pulmonary:  ?   Effort: Pulmonary effort is normal.  ?   Breath sounds: Normal breath sounds.  ?Abdominal:  ?   Palpations: Abdomen is soft.  ?   Tenderness: There is generalized abdominal tenderness.  ?   Comments: Mild diffuse tenderness.  ?Musculoskeletal:  ?   Cervical back: Neck supple.  ?   Right lower leg: No edema.  ?   Left lower leg: No edema.  ?Skin: ?   General: Skin is warm and dry.  ?   Coloration: Skin is not jaundiced.  ?Neurological:  ?   General: No focal deficit present.  ?   Mental Status: She is alert and oriented to person, place, and time.  ?Psychiatric:     ?   Mood and Affect: Mood normal.     ?   Behavior: Behavior normal.  ? ? ?Data Reviewed: ? ?There are no new results to review at this time. ? ?Assessment and Plan: ?Principal Problem: ?  Symptomatic anemia ?With persisting  ?  Sinus tachycardia ?Admit to telemetry/inpatient. ?Continue LR at 125 mL/h. ?Monitor hematocrit and hemoglobin. ?Transfuse as needed. ? ?Active Problems: ?  Coronary atherosclerosis ?EKG nonischemic. ?Check  echocardiogram. ?Continue simvastatin. ?May begin low-dose aspirin once cleared by GYN. ?Follow-up with cardiology as an outpatient. ? ?  Diabetes mellitus type 2 with complications (HCC) ?Carbohydrate modified diet. ?Check hemoglobin A1c. ?Continue glimepiride 1 mg p.o. daily. ?Continue metformin 500 mg p.o. daily. ?CBG monitoring with RI SS. ? ?  Hyperlipidemia associated with type 2 diabetes mellitus (HCC) ?  Aortic atherosclerosis ?Continue simvastatin 20 mg p.o. daily. ? ?  Multinodular goiter ?Continue levothyroxine 50 mcg p.o. daily. ? ?  Family history of ovarian cancer ?Status post bilateral salpingo-oophorectomy. ?Continue postop care per GYN. ? ? ? Advance Care Planning:   Code Status: Full Code  ? ?Consults: GYN (Dr. Henderson Cloudomblin). ? ?Family Communication:  ? ?Severity of Illness: ?The appropriate patient status for this  patient is INPATIENT. Inpatient status is judged to be reasonable and necessary in order to provide the required intensity of service to ensure the patient's safety. The patient's presenting symptoms, physical exam findings, and initial radiographic and laboratory data in the context of their chronic comorbidities is felt to place them at high risk for further clinical deterioration. Furthermore, it is not anticipated that the patient will be medically stable for discharge from the hospital within 2 midnights of admission.  ? ?* I certify that at the point of admission it is my clinical judgment that the patient will require inpatient hospital care spanning beyond 2 midnights from the point of admission due to high intensity of service, high risk for further deterioration and high frequency of surveillance required.* ? ?Author: ?Bobette Mo, MD ?07/08/2021 4:06 PM ? ?For on call review [email or web address].  ? ?This document was prepared using Dragon voice recognition software and may contain some unintended transcription errors. ?

## 2021-07-08 NOTE — Progress Notes (Signed)
Patient remains comfortable, no N/V, no dizziness, good pain relief. ? ?Today's Vitals  ? 07/08/21 0424 07/08/21 0615 07/08/21 0800 07/08/21 0917  ?BP: (!) 127/58  131/60 139/85  ?Pulse: (!) 109  (!) 111 (!) 102  ?Resp: 18  (!) 26 18  ?Temp: 99.2 ?F (37.3 ?C)  98.5 ?F (36.9 ?C) 98.2 ?F (36.8 ?C)  ?TempSrc:      ?SpO2: 97%  95% 97%  ?Weight:      ?Height:      ?PainSc: 1  1  1  1    ? ?Body mass index is 27.4 kg/m?.  ? ?NAD ?Abdomen soft ? ?Results for orders placed or performed during the hospital encounter of 07/07/21 (from the past 24 hour(s))  ?Glucose, capillary     Status: Abnormal  ? Collection Time: 07/07/21  1:25 PM  ?Result Value Ref Range  ? Glucose-Capillary 249 (H) 70 - 99 mg/dL  ?Glucose, capillary     Status: Abnormal  ? Collection Time: 07/07/21  6:03 PM  ?Result Value Ref Range  ? Glucose-Capillary 224 (H) 70 - 99 mg/dL  ?Glucose, capillary     Status: Abnormal  ? Collection Time: 07/07/21  7:56 PM  ?Result Value Ref Range  ? Glucose-Capillary 244 (H) 70 - 99 mg/dL  ?CBC     Status: Abnormal  ? Collection Time: 07/07/21  8:16 PM  ?Result Value Ref Range  ? WBC 16.0 (H) 4.0 - 10.5 K/uL  ? RBC 5.34 (H) 3.87 - 5.11 MIL/uL  ? Hemoglobin 12.4 12.0 - 15.0 g/dL  ? HCT 38.8 36.0 - 46.0 %  ? MCV 72.7 (L) 80.0 - 100.0 fL  ? MCH 23.2 (L) 26.0 - 34.0 pg  ? MCHC 32.0 30.0 - 36.0 g/dL  ? RDW 15.6 (H) 11.5 - 15.5 %  ? Platelets 367 150 - 400 K/uL  ? nRBC 0.0 0.0 - 0.2 %  ?Hemoglobin A1c     Status: Abnormal  ? Collection Time: 07/07/21  8:30 PM  ?Result Value Ref Range  ? Hgb A1c MFr Bld 10.7 (H) 4.8 - 5.6 %  ? Mean Plasma Glucose 260.39 mg/dL  ?Glucose, capillary     Status: Abnormal  ? Collection Time: 07/08/21 12:13 AM  ?Result Value Ref Range  ? Glucose-Capillary 240 (H) 70 - 99 mg/dL  ?Glucose, capillary     Status: Abnormal  ? Collection Time: 07/08/21  4:28 AM  ?Result Value Ref Range  ? Glucose-Capillary 208 (H) 70 - 99 mg/dL  ?CBC     Status: Abnormal  ? Collection Time: 07/08/21  6:25 AM  ?Result Value Ref  Range  ? WBC 12.2 (H) 4.0 - 10.5 K/uL  ? RBC 4.89 3.87 - 5.11 MIL/uL  ? Hemoglobin 11.3 (L) 12.0 - 15.0 g/dL  ? HCT 36.1 36.0 - 46.0 %  ? MCV 73.8 (L) 80.0 - 100.0 fL  ? MCH 23.1 (L) 26.0 - 34.0 pg  ? MCHC 31.3 30.0 - 36.0 g/dL  ? RDW 15.8 (H) 11.5 - 15.5 %  ? Platelets 359 150 - 400 K/uL  ? nRBC 0.0 0.0 - 0.2 %  ?Glucose, capillary     Status: Abnormal  ? Collection Time: 07/08/21  8:12 AM  ?Result Value Ref Range  ? Glucose-Capillary 136 (H) 70 - 99 mg/dL  ?CBC     Status: Abnormal  ? Collection Time: 07/08/21  9:32 AM  ?Result Value Ref Range  ? WBC 9.9 4.0 - 10.5 K/uL  ? RBC 4.54 3.87 -  5.11 MIL/uL  ? Hemoglobin 10.4 (L) 12.0 - 15.0 g/dL  ? HCT 33.2 (L) 36.0 - 46.0 %  ? MCV 73.1 (L) 80.0 - 100.0 fL  ? MCH 22.9 (L) 26.0 - 34.0 pg  ? MCHC 31.3 30.0 - 36.0 g/dL  ? RDW 15.9 (H) 11.5 - 15.5 %  ? Platelets 323 150 - 400 K/uL  ? nRBC 0.0 0.0 - 0.2 %  ?  ? ?A/P: Tachycardia and anemia continue ?         CT shows no evidence of intraperitoneal bleeding and abdomen is soft ? ?        Respiratory panel pending ?        Consult to hospitalist pending ?        CBC in 4 hours ?        Continue IV fluids ?        NPO ?          ?

## 2021-07-09 ENCOUNTER — Encounter (HOSPITAL_BASED_OUTPATIENT_CLINIC_OR_DEPARTMENT_OTHER): Payer: Self-pay | Admitting: Obstetrics and Gynecology

## 2021-07-09 DIAGNOSIS — E1169 Type 2 diabetes mellitus with other specified complication: Secondary | ICD-10-CM | POA: Diagnosis not present

## 2021-07-09 DIAGNOSIS — Z8041 Family history of malignant neoplasm of ovary: Secondary | ICD-10-CM

## 2021-07-09 DIAGNOSIS — R000 Tachycardia, unspecified: Secondary | ICD-10-CM | POA: Diagnosis not present

## 2021-07-09 DIAGNOSIS — E785 Hyperlipidemia, unspecified: Secondary | ICD-10-CM | POA: Diagnosis not present

## 2021-07-09 DIAGNOSIS — E118 Type 2 diabetes mellitus with unspecified complications: Secondary | ICD-10-CM

## 2021-07-09 DIAGNOSIS — D649 Anemia, unspecified: Secondary | ICD-10-CM | POA: Diagnosis not present

## 2021-07-09 LAB — GLUCOSE, CAPILLARY
Glucose-Capillary: 110 mg/dL — ABNORMAL HIGH (ref 70–99)
Glucose-Capillary: 149 mg/dL — ABNORMAL HIGH (ref 70–99)
Glucose-Capillary: 155 mg/dL — ABNORMAL HIGH (ref 70–99)
Glucose-Capillary: 205 mg/dL — ABNORMAL HIGH (ref 70–99)

## 2021-07-09 MED ORDER — OXYCODONE HCL 5 MG PO TABS
5.0000 mg | ORAL_TABLET | ORAL | 0 refills | Status: AC | PRN
Start: 2021-07-09 — End: 2021-07-12

## 2021-07-09 MED ORDER — INSULIN ASPART 100 UNIT/ML IJ SOLN: 0.0000 [IU] | Freq: Three times a day (TID) | INTRAMUSCULAR | Status: DC

## 2021-07-09 MED ORDER — POLYETHYLENE GLYCOL 3350 17 G PO PACK
17.0000 g | PACK | Freq: Two times a day (BID) | ORAL | Status: DC
Start: 2021-07-09 — End: 2021-07-09
  Administered 2021-07-09: 09:00:00 17 g via ORAL
  Filled 2021-07-09: qty 1

## 2021-07-09 MED ORDER — IBUPROFEN 600 MG PO TABS
600.0000 mg | ORAL_TABLET | Freq: Four times a day (QID) | ORAL | 0 refills | Status: DC | PRN
Start: 2021-07-09 — End: 2021-08-17

## 2021-07-09 MED ORDER — INSULIN ASPART 100 UNIT/ML IJ SOLN: 0.0000 [IU] | Freq: Every day | INTRAMUSCULAR | Status: DC

## 2021-07-09 MED ORDER — INSULIN ASPART 100 UNIT/ML IJ SOLN: 4.0000 [IU] | Freq: Three times a day (TID) | INTRAMUSCULAR | Status: DC

## 2021-07-09 MED ORDER — POLYETHYLENE GLYCOL 3350 17 G PO PACK: 17.0000 g | PACK | Freq: Every day | ORAL | Status: DC

## 2021-07-09 MED ORDER — POLYETHYLENE GLYCOL 3350 17 G PO PACK
17.0000 g | PACK | Freq: Two times a day (BID) | ORAL | Status: DC
Start: 2021-07-09 — End: 2021-07-09

## 2021-07-09 NOTE — Progress Notes (Signed)
?  Transition of Care (TOC) Screening Note ? ? ?Patient Details  ?Name: Nicole Gallagher ?Date of Birth: Nov 03, 1960 ? ? ?Transition of Care [facility] CM/SW Contact:    ?Dessa Phi, RN ?Phone Number: ?07/09/2021, 10:38 AM ? ? ? ?Transition of Care Department [facility]) has reviewed patient and no TOC needs have been identified at this time. We will continue to monitor patient advancement through interdisciplinary progression rounds. If new patient transition needs arise, please place a TOC consult. ?  ?

## 2021-07-09 NOTE — Progress Notes (Signed)
[facility] PROGRESS NOTE    Progress Note  Nicole Gallagher  ZOX:[id number] DOB: 1960/12/12 DOA: 07/07/2021 PCP: Myrlene Brokerrawford, Elizabeth A, MD     Brief Narrative:   Nicole JoyLisa Dumond is an 61 y.o. female past medical history significant for type 2 diabetes mellitus, multinodular goiter who underwent laparoscopic bilateral salpingo-oophorectomy the day prior to admission after this she started developing persistent tachycardia and acute blood loss anemia with some mild diffuse incisional pain, hemoglobin was 12 prior to surgery postsurgery was 10.8.  CT of the abdomen pelvis showed a small 15 cc hematoma with scattered gas and locules no large hematoma, small loculation of gas along the incision    Assessment/Plan:   Sinus tachycardia secondary to Symptomatic anemia/postop anemia: Started on IV fluid resuscitation. Hemoglobin has remained relatively stable. We will continue to monitor CBCs regularly. Pain is controlled she is tolerating her diet, stable to be discharged from's internal medicine standpoint  CAD: EKG showed no signs of ischemia, continue statins. To start aspirin when cleared by GYN.  Diabetes mellitus type 2: She was continue on glimepiride and metformin we will go ahead and hold metformin. A1c 10.7 Continue sliding scale insulin.  Hyperlipidemia: Continue statins.  Multinodular goiter: Continue Synthroid.  History of ovarian cancer: Status post bilateral salpingo-oophorectomy, further management per GYN.    Diabetes mellitus type 2 with complications (HCC)     DVT prophylaxis: scd Family Communication:husband Status is: Inpatient Remains inpatient appropriate because: Acute blood loss anemia    Code Status:     Code Status Orders  (From admission, onward)           Start     Ordered   07/07/21 0539  Full code  Continuous        07/07/21 0538           Code Status History     This patient has a current code status but no historical  code status.         IV Access:   Peripheral IV   Procedures and diagnostic studies:   CT ABDOMEN PELVIS WO CONTRAST  Result Date: 07/08/2021 CLINICAL DATA:  Postop day 1 for bilateral salpingo-ophorectomy and diagnostic laparoscopy. Lower abdominal pain. Tachycardia and anemia. EXAM: CT ABDOMEN AND PELVIS WITHOUT CONTRAST TECHNIQUE: Multidetector CT imaging of the abdomen and pelvis was performed following the standard protocol without IV contrast. RADIATION DOSE REDUCTION: This exam was performed according to the departmental dose-optimization program which includes automated exposure control, adjustment of the mA and/or kV according to patient size and/or use of iterative reconstruction technique. COMPARISON:  Pelvic radiograph 12/14/2017 FINDINGS: Lower chest: Descending thoracic aortic and right coronary artery atherosclerotic calcification. Low-density blood pool compatible with anemia. Linear subsegmental atelectasis or scarring in both lower lobes. Small lipoma along the upper margin of the left lateral abdominal wall musculature, image 16 series 2. Hepatobiliary: Unremarkable Pancreas: Unremarkable Spleen: Unremarkable Adrenals/Urinary Tract: Nonobstructive bilateral 3 mm right mid kidney and left mid kidney calculi. No hydronephrosis or hydroureter. Both adrenal glands appear normal. The urinary bladder itself appears grossly unremarkable although there is gas and likely some hematoma in the space of Retzius, with suspected hematoma measuring about 5.4 by 1.7 by 3.1 cm (volume = 15 cm^3) in this region. Stomach/Bowel: No dilated bowel or substantial bowel wall thickening identified. There is a trace amount of free intraperitoneal gas not considered substantially abnormal on postop day 1, and accordingly not considered directly attributable to viscus perforation. Vascular/Lymphatic: Atherosclerosis is present, including aortoiliac atherosclerotic  disease. Reproductive: The uterus and ovaries  are absent. No significant abnormality in the vicinity of the vaginal cuff. Other: As noted above, there are numerous loculations of gas in the space of Retzius along with approximately 15 cc hematoma along the space of Retzius. No substantial ascites Musculoskeletal: Infraumbilical gas along with small locules of gas along a limited Pfannenstiel incision noted, likely incidental postoperative appearance. No large hematoma along the incision or trocar sites. Expected subcutaneous edema along the limited Pfannenstiel incision. There is likely some inflammation and mild thickening of the lower rectus abdominus muscles but no large hematoma or substantially symmetry. No substantial hematoma along the gluteal musculature or upper thigh musculature noted. IMPRESSION: 1. There is an approximately 15 cc hematoma along with scattered gas locules in the space of Retzius. No large hematoma is identified in the pelvis to account for the patient's anemia. 2. Small loculations of gas along the limited Pfannenstiel incision and along the infraumbilical trocar site, likely incidental postoperative findings. There is also a trace amount of residual intraperitoneal gas adjacent to the liver, not considered substantially abnormal on postop day 1. 3. Small single bilateral nonobstructive renal calculi. 4. Other imaging findings of potential clinical significance: Aortic Atherosclerosis (ICD10-I70.0). Right coronary artery atherosclerosis. Linear subsegmental atelectasis or scarring in both lower lobes. Electronically Signed   By: Gaylyn Rong M.D.   On: 07/08/2021 09:18     Medical Consultants:   None.   Subjective:    Nicole Gallagher she relates her pain is controlled tolerating her diet has not had a bowel movement.  Objective:    Vitals:   07/08/21 1921 07/08/21 1928 07/08/21 2331 07/09/21 0332  BP: 108/70 (!) 141/69 130/61 (!) 143/80  Pulse: 81 (!) 110 (!) 106 95  Resp: 20 18 15 15   Temp: 97.9 F (36.6 C)  97.9 F (36.6 C) 98.9 F (37.2 C) 98.6 F (37 C)  TempSrc: Oral Oral Oral Oral  SpO2: 94% 94% 99% 99%  Weight:      Height:       SpO2: 99 % O2 Flow Rate (L/min): 2 L/min   Intake/Output Summary (Last 24 hours) at 07/09/2021 0742 Last data filed at 07/08/2021 2312 Gross per 24 hour  Intake 4144.97 ml  Output 800 ml  Net 3344.97 ml   Filed Weights   06/30/21 1545 07/07/21 0559  Weight: 67.6 kg 67.9 kg    Exam: General exam: In no acute distress. Respiratory system: Good air movement and clear to auscultation. Cardiovascular system: S1 & S2 heard, RRR.  Gastrointestinal system: Abdomen is nondistended, soft and nontender.  Extremities: No pedal edema. Skin: No rashes, lesions or ulcers Psychiatry: Judgement and insight appear normal. Mood & affect appropriate.    Data Reviewed:    Labs: Basic Metabolic Panel: No results for input(s): NA, K, CL, CO2, GLUCOSE, BUN, CREATININE, CALCIUM, MG, PHOS in the last 168 hours. GFR CrCl cannot be calculated (Patient's most recent lab result is older than the maximum 21 days allowed.). Liver Function Tests: No results for input(s): AST, ALT, ALKPHOS, BILITOT, PROT, ALBUMIN in the last 168 hours. No results for input(s): LIPASE, AMYLASE in the last 168 hours. No results for input(s): AMMONIA in the last 168 hours. Coagulation profile No results for input(s): INR, PROTIME in the last 168 hours. COVID-19 Labs  No results for input(s): DDIMER, FERRITIN, LDH, CRP in the last 72 hours.  Lab Results  Component Value Date   SARSCOV2NAA NEGATIVE 07/08/2021    CBC: Recent Labs  Lab 07/07/21 0613 07/07/21 2016 07/08/21 0625 07/08/21 0932 07/08/21 1349  WBC 5.1 16.0* 12.2* 9.9 9.5  HGB 13.8 12.4 11.3* 10.4* 10.8*  HCT 44.0 38.8 36.1 33.2* 33.8*  MCV 73.1* 72.7* 73.8* 73.1* 72.5*  PLT 385 367 359 323 330   Cardiac Enzymes: No results for input(s): CKTOTAL, CKMB, CKMBINDEX, TROPONINI in the last 168 hours. BNP (last 3  results) No results for input(s): PROBNP in the last 8760 hours. CBG: Recent Labs  Lab 07/08/21 0812 07/08/21 1157 07/08/21 1527 07/08/21 2008 07/08/21 2359  GLUCAP 136* 161* 167* 193* 205*   D-Dimer: No results for input(s): DDIMER in the last 72 hours. Hgb A1c: Recent Labs    07/07/21 2030  HGBA1C 10.7*   Lipid Profile: No results for input(s): CHOL, HDL, LDLCALC, TRIG, CHOLHDL, LDLDIRECT in the last 72 hours. Thyroid function studies: No results for input(s): TSH, T4TOTAL, T3FREE, THYROIDAB in the last 72 hours.  Invalid input(s): FREET3 Anemia work up: No results for input(s): VITAMINB12, FOLATE, FERRITIN, TIBC, IRON, RETICCTPCT in the last 72 hours. Sepsis Labs: Recent Labs  Lab 07/07/21 2016 07/08/21 0625 07/08/21 0932 07/08/21 1349  WBC 16.0* 12.2* 9.9 9.5   Microbiology Recent Results (from the past 240 hour(s))  Resp Panel by RT-PCR (Flu A&B, Covid) Nasopharyngeal Swab     Status: None   Collection Time: 07/08/21  9:52 AM   Specimen: Nasopharyngeal Swab; Nasopharyngeal(NP) swabs in vial transport medium  Result Value Ref Range Status   SARS Coronavirus 2 by RT PCR NEGATIVE NEGATIVE Final    Comment: (NOTE) SARS-CoV-2 target nucleic acids are NOT DETECTED.  The SARS-CoV-2 RNA is generally detectable in upper respiratory specimens during the acute phase of infection. The lowest concentration of SARS-CoV-2 viral copies this assay can detect is 138 copies/mL. A negative result does not preclude SARS-Cov-2 infection and should not be used as the sole basis for treatment or other patient management decisions. A negative result may occur with  improper specimen collection/handling, submission of specimen other than nasopharyngeal swab, presence of viral mutation(s) within the areas targeted by this assay, and inadequate number of viral copies(<138 copies/mL). A negative result must be combined with clinical observations, patient history, and  epidemiological information. The expected result is Negative.  Fact Sheet for Patients:  BloggerCourse.com  Fact Sheet for Healthcare Providers:  SeriousBroker.it  This test is no t yet approved or cleared by the [place] FDA and  has been authorized for detection and/or diagnosis of SARS-CoV-2 by FDA under an Emergency Use Authorization (EUA). This EUA will remain  in effect (meaning this test can be used) for the duration of the COVID-19 declaration under Section 564(b)(1) of the Act, 21 U.S.C.section 360bbb-3(b)(1), unless the authorization is terminated  or revoked sooner.       Influenza A by PCR NEGATIVE NEGATIVE Final   Influenza B by PCR NEGATIVE NEGATIVE Final    Comment: (NOTE) The Xpert Xpress SARS-CoV-2/FLU/RSV plus assay is intended as an aid in the diagnosis of influenza from Nasopharyngeal swab specimens and should not be used as a sole basis for treatment. Nasal washings and aspirates are unacceptable for Xpert Xpress SARS-CoV-2/FLU/RSV testing.  Fact Sheet for Patients: BloggerCourse.com  Fact Sheet for Healthcare Providers: SeriousBroker.it  This test is not yet approved or cleared by the [place] FDA and has been authorized for detection and/or diagnosis of SARS-CoV-2 by FDA under an Emergency Use Authorization (EUA). This EUA will remain in effect (meaning this test can be used) for  the duration of the COVID-19 declaration under Section 564(b)(1) of the Act, 21 U.S.C. section 360bbb-3(b)(1), unless the authorization is terminated or revoked.  Performed at [facility], [address], [place], [place] [address]      Medications:    acetaminophen  1,000 mg Oral Q6H   glimepiride  1 mg Oral Q breakfast   influenza vac split quadrivalent PF  0.5 mL Intramuscular Tomorrow-1000   insulin aspart  0-15 Units Subcutaneous Q4H    levothyroxine  50 mcg Oral Q0600   metFORMIN  500 mg Oral Q breakfast   montelukast  10 mg Oral QHS   pantoprazole  40 mg Oral Daily   senna  1 tablet Oral BID   Continuous Infusions:  lactated ringers 125 mL/hr at 07/08/21 2312      LOS: 1 day   Marinda Elk  [facility]  07/09/2021, 7:42 AM

## 2021-07-09 NOTE — Progress Notes (Signed)
Feels good, ambulating without dizziness, passing flatus, good pain relief ? ?Today's Vitals  ? 07/08/21 2331 07/09/21 0320 07/09/21 2458 07/09/21 0405  ?BP: 130/61  (!) 143/80   ?Pulse: (!) 106  95   ?Resp: 15  15   ?Temp: 98.9 ?F (37.2 ?C)  98.6 ?F (37 ?C)   ?TempSrc: Oral  Oral   ?SpO2: 99%  99%   ?Weight:      ?Height:      ?PainSc:  4   Asleep  ? ?Body mass index is 27.4 kg/m?.  ? ?Abdomen soft ? ?A/P: OK for discharge from GYN standpoint ?        FU 1-2 weeks in office ?

## 2021-07-09 NOTE — Discharge Summary (Addendum)
Physician Discharge Summary  Nicole Gallagher BJY:[id number] DOB: 22-Jan-1961 DOA: 07/07/2021  PCP: Myrlene Broker, MD  Admit date: 07/07/2021 Discharge date: 07/09/2021  Admitted From: Home Disposition:  Home  Recommendations for Outpatient Follow-up:  Follow up with PCP in 1-2 weeks Please obtain BMP/CBC in one week  Home Health:No Equipment/Devices:None  Discharge Condition:Stable CODE STATUS:Full Diet recommendation: Heart Healthy  Brief/Interim Summary: 61 y.o. female past medical history significant for type 2 diabetes mellitus, multinodular goiter who underwent laparoscopic bilateral salpingo-oophorectomy the day prior to admission after this she started developing persistent tachycardia and acute blood loss anemia with some mild diffuse incisional pain, hemoglobin was 12 prior to surgery postsurgery was 10.8.  CT of the abdomen pelvis showed a small 15 cc hematoma with scattered gas and locules no large hematoma, small loculation of gas along the incision    Discharge Diagnoses:  Principal Problem:   Symptomatic anemia Active Problems:   Diabetes mellitus type 2 with complications (HCC)   Hyperlipidemia associated with type 2 diabetes mellitus (HCC)   Family history of ovarian cancer   Multinodular goiter   Sinus tachycardia   Coronary atherosclerosis  Sinus tachycardia secondary to symptomatic anemia/postop anemia with a small 15 cc hematoma in the  Retzius space: She was started on IV fluids her hemoglobin was rechecked and her hemoglobin remained stable. Pain is controlled GYN evaluated the patient recommended the patient to be discharged. She will follow-up with GYN in 2 to 4 weeks as an outpatient.  CAD: EKG showed no signs of ischemia continue statins. She will restart aspirin as an outpatient, when she orients is okay to restart.  Diabetes mellitus type 2 with hyperglycemia: No changes made continue glimepiride and metformin she will need to follow-up with PCP  as an outpatient she will need to be started on statins As an outpatient.  Hyperlipidemia: continue statins.  Multinodular goiter: Continue Synthroid.  History of ovarian cancer: Status post bilateral salpingo-oophorectomy by GYN she will follow-up with them as an outpatient.  Discharge Instructions  Discharge Instructions     Diet - low sodium heart healthy   Complete by: As directed    Increase activity slowly   Complete by: As directed    No wound care   Complete by: As directed       Allergies as of 07/09/2021   No Known Allergies      Medication List     TAKE these medications    glimepiride 1 MG tablet Commonly known as: AMARYL Take 1 tablet (1 mg total) by mouth daily with breakfast.   glucose blood test strip Commonly known as: Accu-Chek Guide 1 each by Other route daily. And lancets 1/day   ibuprofen 600 MG tablet Commonly known as: ADVIL Take 1 tablet (600 mg total) by mouth every 6 (six) hours as needed for moderate pain.   levothyroxine 50 MCG tablet Commonly known as: SYNTHROID Take 1 tablet (50 mcg total) by mouth daily.   metFORMIN 500 MG 24 hr tablet Commonly known as: Glucophage XR Take 1 tablet (500 mg total) by mouth daily with breakfast.   montelukast 10 MG tablet Commonly known as: SINGULAIR Take 1 tablet (10 mg total) by mouth at bedtime.   oxyCODONE 5 MG immediate release tablet Commonly known as: Oxy IR/ROXICODONE Take 1-2 tablets (5-10 mg total) by mouth every 4 (four) hours as needed for up to 3 days for moderate pain.   simvastatin 20 MG tablet Commonly known as: ZOCOR Take 1 tablet (20  mg total) by mouth at bedtime.        No Known Allergies  Consultations: GYN   Procedures/Studies: CT ABDOMEN PELVIS WO CONTRAST  Result Date: 07/08/2021 CLINICAL DATA:  Postop day 1 for bilateral salpingo-ophorectomy and diagnostic laparoscopy. Lower abdominal pain. Tachycardia and anemia. EXAM: CT ABDOMEN AND PELVIS WITHOUT  CONTRAST TECHNIQUE: Multidetector CT imaging of the abdomen and pelvis was performed following the standard protocol without IV contrast. RADIATION DOSE REDUCTION: This exam was performed according to the departmental dose-optimization program which includes automated exposure control, adjustment of the mA and/or kV according to patient size and/or use of iterative reconstruction technique. COMPARISON:  Pelvic radiograph 12/14/2017 FINDINGS: Lower chest: Descending thoracic aortic and right coronary artery atherosclerotic calcification. Low-density blood pool compatible with anemia. Linear subsegmental atelectasis or scarring in both lower lobes. Small lipoma along the upper margin of the left lateral abdominal wall musculature, image 16 series 2. Hepatobiliary: Unremarkable Pancreas: Unremarkable Spleen: Unremarkable Adrenals/Urinary Tract: Nonobstructive bilateral 3 mm right mid kidney and left mid kidney calculi. No hydronephrosis or hydroureter. Both adrenal glands appear normal. The urinary bladder itself appears grossly unremarkable although there is gas and likely some hematoma in the space of Retzius, with suspected hematoma measuring about 5.4 by 1.7 by 3.1 cm (volume = 15 cm^3) in this region. Stomach/Bowel: No dilated bowel or substantial bowel wall thickening identified. There is a trace amount of free intraperitoneal gas not considered substantially abnormal on postop day 1, and accordingly not considered directly attributable to viscus perforation. Vascular/Lymphatic: Atherosclerosis is present, including aortoiliac atherosclerotic disease. Reproductive: The uterus and ovaries are absent. No significant abnormality in the vicinity of the vaginal cuff. Other: As noted above, there are numerous loculations of gas in the space of Retzius along with approximately 15 cc hematoma along the space of Retzius. No substantial ascites Musculoskeletal: Infraumbilical gas along with small locules of gas along a  limited Pfannenstiel incision noted, likely incidental postoperative appearance. No large hematoma along the incision or trocar sites. Expected subcutaneous edema along the limited Pfannenstiel incision. There is likely some inflammation and mild thickening of the lower rectus abdominus muscles but no large hematoma or substantially symmetry. No substantial hematoma along the gluteal musculature or upper thigh musculature noted. IMPRESSION: 1. There is an approximately 15 cc hematoma along with scattered gas locules in the space of Retzius. No large hematoma is identified in the pelvis to account for the patient's anemia. 2. Small loculations of gas along the limited Pfannenstiel incision and along the infraumbilical trocar site, likely incidental postoperative findings. There is also a trace amount of residual intraperitoneal gas adjacent to the liver, not considered substantially abnormal on postop day 1. 3. Small single bilateral nonobstructive renal calculi. 4. Other imaging findings of potential clinical significance: Aortic Atherosclerosis (ICD10-I70.0). Right coronary artery atherosclerosis. Linear subsegmental atelectasis or scarring in both lower lobes. Electronically Signed   By: Gaylyn Rong M.D.   On: 07/08/2021 09:18   (Echo, Carotid, EGD, Colonoscopy, ERCP)    Subjective: No complaints  Discharge Exam: Vitals:   07/08/21 2331 07/09/21 0332  BP: 130/61 (!) 143/80  Pulse: (!) 106 95  Resp: 15 15  Temp: 98.9 F (37.2 C) 98.6 F (37 C)  SpO2: 99% 99%   Vitals:   07/08/21 1921 07/08/21 1928 07/08/21 2331 07/09/21 0332  BP: 108/70 (!) 141/69 130/61 (!) 143/80  Pulse: 81 (!) 110 (!) 106 95  Resp: 20 18 15 15   Temp: 97.9 F (36.6 C) 97.9 F (36.6  C) 98.9 F (37.2 C) 98.6 F (37 C)  TempSrc: Oral Oral Oral Oral  SpO2: 94% 94% 99% 99%  Weight:      Height:        General: Pt is alert, awake, not in acute distress Cardiovascular: RRR, S1/S2 +, no rubs, no  gallops Respiratory: CTA bilaterally, no wheezing, no rhonchi Abdominal: Soft, NT, ND, bowel sounds + Extremities: no edema, no cyanosis    The results of significant diagnostics from this hospitalization (including imaging, microbiology, ancillary and laboratory) are listed below for reference.     Microbiology: Recent Results (from the past 240 hour(s))  Resp Panel by RT-PCR (Flu A&B, Covid) Nasopharyngeal Swab     Status: None   Collection Time: 07/08/21  9:52 AM   Specimen: Nasopharyngeal Swab; Nasopharyngeal(NP) swabs in vial transport medium  Result Value Ref Range Status   SARS Coronavirus 2 by RT PCR NEGATIVE NEGATIVE Final    Comment: (NOTE) SARS-CoV-2 target nucleic acids are NOT DETECTED.  The SARS-CoV-2 RNA is generally detectable in upper respiratory specimens during the acute phase of infection. The lowest concentration of SARS-CoV-2 viral copies this assay can detect is 138 copies/mL. A negative result does not preclude SARS-Cov-2 infection and should not be used as the sole basis for treatment or other patient management decisions. A negative result may occur with  improper specimen collection/handling, submission of specimen other than nasopharyngeal swab, presence of viral mutation(s) within the areas targeted by this assay, and inadequate number of viral copies(<138 copies/mL). A negative result must be combined with clinical observations, patient history, and epidemiological information. The expected result is Negative.  Fact Sheet for Patients:  BloggerCourse.com  Fact Sheet for Healthcare Providers:  SeriousBroker.it  This test is no t yet approved or cleared by the [place] FDA and  has been authorized for detection and/or diagnosis of SARS-CoV-2 by FDA under an Emergency Use Authorization (EUA). This EUA will remain  in effect (meaning this test can be used) for the duration of the COVID-19  declaration under Section 564(b)(1) of the Act, 21 U.S.C.section 360bbb-3(b)(1), unless the authorization is terminated  or revoked sooner.       Influenza A by PCR NEGATIVE NEGATIVE Final   Influenza B by PCR NEGATIVE NEGATIVE Final    Comment: (NOTE) The Xpert Xpress SARS-CoV-2/FLU/RSV plus assay is intended as an aid in the diagnosis of influenza from Nasopharyngeal swab specimens and should not be used as a sole basis for treatment. Nasal washings and aspirates are unacceptable for Xpert Xpress SARS-CoV-2/FLU/RSV testing.  Fact Sheet for Patients: BloggerCourse.com  Fact Sheet for Healthcare Providers: SeriousBroker.it  This test is not yet approved or cleared by the [place] FDA and has been authorized for detection and/or diagnosis of SARS-CoV-2 by FDA under an Emergency Use Authorization (EUA). This EUA will remain in effect (meaning this test can be used) for the duration of the COVID-19 declaration under Section 564(b)(1) of the Act, 21 U.S.C. section 360bbb-3(b)(1), unless the authorization is terminated or revoked.  Performed at [facility], [address], [place], [place] [address]      Labs: BNP (last 3 results) No results for input(s): BNP in the last 8760 hours. Basic Metabolic Panel: No results for input(s): NA, K, CL, CO2, GLUCOSE, BUN, CREATININE, CALCIUM, MG, PHOS in the last 168 hours. Liver Function Tests: No results for input(s): AST, ALT, ALKPHOS, BILITOT, PROT, ALBUMIN in the last 168 hours. No results for input(s): LIPASE, AMYLASE in the  last 168 hours. No results for input(s): AMMONIA in the last 168 hours. CBC: Recent Labs  Lab 07/07/21 0613 07/07/21 2016 07/08/21 0625 07/08/21 0932 07/08/21 1349  WBC 5.1 16.0* 12.2* 9.9 9.5  HGB 13.8 12.4 11.3* 10.4* 10.8*  HCT 44.0 38.8 36.1 33.2* 33.8*  MCV 73.1* 72.7* 73.8* 73.1* 72.5*  PLT 385 367 359 323 330   Cardiac  Enzymes: No results for input(s): CKTOTAL, CKMB, CKMBINDEX, TROPONINI in the last 168 hours. BNP: Invalid input(s): POCBNP CBG: Recent Labs  Lab 07/08/21 2008 07/08/21 2359 07/09/21 0333 07/09/21 0721 07/09/21 0807  GLUCAP 193* 205* 110* 155* 149*   D-Dimer No results for input(s): DDIMER in the last 72 hours. Hgb A1c Recent Labs    07/07/21 2030  HGBA1C 10.7*   Lipid Profile No results for input(s): CHOL, HDL, LDLCALC, TRIG, CHOLHDL, LDLDIRECT in the last 72 hours. Thyroid function studies No results for input(s): TSH, T4TOTAL, T3FREE, THYROIDAB in the last 72 hours.  Invalid input(s): FREET3 Anemia work up No results for input(s): VITAMINB12, FOLATE, FERRITIN, TIBC, IRON, RETICCTPCT in the last 72 hours. Urinalysis    Component Value Date/Time   BILIRUBINUR Neg 03/07/2018 1138   PROTEINUR Negative 03/07/2018 1138   UROBILINOGEN 0.2 03/07/2018 1138   NITRITE Neg 03/07/2018 1138   LEUKOCYTESUR Negative 03/07/2018 1138   Sepsis Labs Invalid input(s): PROCALCITONIN,  WBC,  LACTICIDVEN Microbiology Recent Results (from the past 240 hour(s))  Resp Panel by RT-PCR (Flu A&B, Covid) Nasopharyngeal Swab     Status: None   Collection Time: 07/08/21  9:52 AM   Specimen: Nasopharyngeal Swab; Nasopharyngeal(NP) swabs in vial transport medium  Result Value Ref Range Status   SARS Coronavirus 2 by RT PCR NEGATIVE NEGATIVE Final    Comment: (NOTE) SARS-CoV-2 target nucleic acids are NOT DETECTED.  The SARS-CoV-2 RNA is generally detectable in upper respiratory specimens during the acute phase of infection. The lowest concentration of SARS-CoV-2 viral copies this assay can detect is 138 copies/mL. A negative result does not preclude SARS-Cov-2 infection and should not be used as the sole basis for treatment or other patient management decisions. A negative result may occur with  improper specimen collection/handling, submission of specimen other than nasopharyngeal swab,  presence of viral mutation(s) within the areas targeted by this assay, and inadequate number of viral copies(<138 copies/mL). A negative result must be combined with clinical observations, patient history, and epidemiological information. The expected result is Negative.  Fact Sheet for Patients:  BloggerCourse.comhttps://www.fda.gov/media/152166/download  Fact Sheet for Healthcare Providers:  SeriousBroker.ithttps://www.fda.gov/media/152162/download  This test is no t yet approved or cleared by the [place] FDA and  has been authorized for detection and/or diagnosis of SARS-CoV-2 by FDA under an Emergency Use Authorization (EUA). This EUA will remain  in effect (meaning this test can be used) for the duration of the COVID-19 declaration under Section 564(b)(1) of the Act, 21 U.S.C.section 360bbb-3(b)(1), unless the authorization is terminated  or revoked sooner.       Influenza A by PCR NEGATIVE NEGATIVE Final   Influenza B by PCR NEGATIVE NEGATIVE Final    Comment: (NOTE) The Xpert Xpress SARS-CoV-2/FLU/RSV plus assay is intended as an aid in the diagnosis of influenza from Nasopharyngeal swab specimens and should not be used as a sole basis for treatment. Nasal washings and aspirates are unacceptable for Xpert Xpress SARS-CoV-2/FLU/RSV testing.  Fact Sheet for Patients: BloggerCourse.comhttps://www.fda.gov/media/152166/download  Fact Sheet for Healthcare Providers: SeriousBroker.ithttps://www.fda.gov/media/152162/download  This test is not yet approved or cleared by the [place] FDA and has been authorized for detection and/or diagnosis of SARS-CoV-2 by FDA under an Emergency Use Authorization (EUA). This EUA will remain in effect (meaning this test can be used) for the duration of the COVID-19 declaration under Section 564(b)(1) of the Act, 21 U.S.C. section 360bbb-3(b)(1), unless the authorization is terminated or revoked.  Performed at [facility], [address], [place], [place] [address]       SIGNED:   Marinda Elk, MD  [facility] 07/09/2021, 10:48 AM Pager   If 7PM-7AM, please contact night-coverage [email or web address] Password [email or web address]

## 2021-08-04 ENCOUNTER — Ambulatory Visit: Payer: BC Managed Care – PPO | Admitting: Internal Medicine

## 2021-08-10 ENCOUNTER — Ambulatory Visit: Payer: BC Managed Care – PPO | Admitting: Internal Medicine

## 2021-08-17 ENCOUNTER — Encounter: Payer: Self-pay | Admitting: Internal Medicine

## 2021-08-17 ENCOUNTER — Ambulatory Visit (INDEPENDENT_AMBULATORY_CARE_PROVIDER_SITE_OTHER): Payer: BC Managed Care – PPO | Admitting: Internal Medicine

## 2021-08-17 DIAGNOSIS — R5383 Other fatigue: Secondary | ICD-10-CM | POA: Diagnosis not present

## 2021-08-17 DIAGNOSIS — E118 Type 2 diabetes mellitus with unspecified complications: Secondary | ICD-10-CM

## 2021-08-17 MED ORDER — GLIMEPIRIDE 2 MG PO TABS: 2.0000 mg | ORAL_TABLET | Freq: Every day | ORAL | 3 refills | Status: DC

## 2021-08-17 MED ORDER — METFORMIN HCL ER 750 MG PO TB24: 750.0000 mg | ORAL_TABLET | Freq: Every day | ORAL | 3 refills | Status: DC

## 2021-08-17 NOTE — Progress Notes (Signed)
? ?  Subjective:  ? ?Patient ID: Nicole Gallagher, female    DOB: [birth year], 61 y.o.   MRN: [id number] ? ?HPI ?The patient is a 61 YO female coming in for follow up diabetes. ? ?Review of Systems  ?Constitutional: Negative.   ?HENT: Negative.    ?Eyes: Negative.   ?Respiratory:  Negative for cough, chest tightness and shortness of breath.   ?Cardiovascular:  Negative for chest pain, palpitations and leg swelling.  ?Gastrointestinal:  Negative for abdominal distention, abdominal pain, constipation, diarrhea, nausea and vomiting.  ?Musculoskeletal: Negative.   ?Skin: Negative.   ?Neurological: Negative.   ?Psychiatric/Behavioral: Negative.    ? ?Objective:  ?Physical Exam ?Constitutional:   ?   Appearance: She is well-developed.  ?HENT:  ?   Head: Normocephalic and atraumatic.  ?Cardiovascular:  ?   Rate and Rhythm: Normal rate and regular rhythm.  ?Pulmonary:  ?   Effort: Pulmonary effort is normal. No respiratory distress.  ?   Breath sounds: Normal breath sounds. No wheezing or rales.  ?Abdominal:  ?   General: Bowel sounds are normal. There is no distension.  ?   Palpations: Abdomen is soft.  ?   Tenderness: There is no abdominal tenderness. There is no rebound.  ?Musculoskeletal:  ?   Cervical back: Normal range of motion.  ?Skin: ?   General: Skin is warm and dry.  ?Neurological:  ?   Mental Status: She is alert and oriented to person, place, and time.  ?   Coordination: Coordination normal.  ? ? ?Vitals:  ? 08/17/21 0848  ?BP: 122/88  ?Pulse: (!) 110  ?Resp: 18  ?SpO2: 98%  ?Weight: 147 lb 3.2 oz (66.8 kg)  ?Height: 5\' 2"  (1.575 m)  ? ? ?This visit occurred during the SARS-CoV-2 public health emergency.  Safety protocols were in place, including screening questions prior to the visit, additional usage of staff PPE, and extensive cleaning of exam room while observing appropriate contact time as indicated for disinfecting solutions.  ? ?Assessment & Plan:  ?Visit time 25 minutes in face to face communication with  patient and coordination of care, additional 5 minutes spent in record review, coordination or care, ordering tests, communicating/referring to other healthcare professionals, documenting in medical records all on the same day of the visit for total time 30 minutes spent on the visit.  ? ?

## 2021-08-17 NOTE — Patient Instructions (Signed)
We will increase the dose of metformin to 750 mg daily (new prescription sent in) and increase the dose of the glimepiride (amaryl) to 2 mg daily (we sent in new prescription, you can take 2 pills daily until gone).  ? ? ?

## 2021-08-19 DIAGNOSIS — Z09 Encounter for follow-up examination after completed treatment for conditions other than malignant neoplasm: Secondary | ICD-10-CM | POA: Diagnosis not present

## 2021-08-21 ENCOUNTER — Encounter: Payer: Self-pay | Admitting: Internal Medicine

## 2021-08-21 NOTE — Assessment & Plan Note (Signed)
Still having some fatigue which could be related to post-operative and poor control of diabetes. No labs were indicated today. Reviewed pre-operative labs.  ?

## 2021-08-21 NOTE — Assessment & Plan Note (Signed)
We did review labs (HgA1c 10.7) from pre-op and decide to increase metformin to 750 mg xr daily and new rx done. We will also increase amaryl to 2 mg daily from 1 mg daily and new rx done. Follow up 3 months. She is still severely uncontrolled and she is willing to make changes to help get to control. She is taking statin.  ?

## 2021-09-17 DIAGNOSIS — L819 Disorder of pigmentation, unspecified: Secondary | ICD-10-CM | POA: Diagnosis not present

## 2021-09-17 DIAGNOSIS — D1720 Benign lipomatous neoplasm of skin and subcutaneous tissue of unspecified limb: Secondary | ICD-10-CM | POA: Diagnosis not present

## 2021-09-17 DIAGNOSIS — L299 Pruritus, unspecified: Secondary | ICD-10-CM | POA: Diagnosis not present

## 2021-11-17 ENCOUNTER — Ambulatory Visit: Payer: BC Managed Care – PPO | Admitting: Internal Medicine

## 2021-12-30 DIAGNOSIS — E119 Type 2 diabetes mellitus without complications: Secondary | ICD-10-CM | POA: Diagnosis not present

## 2022-03-19 ENCOUNTER — Ambulatory Visit: Payer: BC Managed Care – PPO | Admitting: Internal Medicine

## 2022-04-08 ENCOUNTER — Encounter: Payer: Self-pay | Admitting: Internal Medicine

## 2022-04-08 ENCOUNTER — Ambulatory Visit (INDEPENDENT_AMBULATORY_CARE_PROVIDER_SITE_OTHER): Payer: BC Managed Care – PPO | Admitting: Internal Medicine

## 2022-04-08 VITALS — BP 140/60 | HR 100 | Temp 97.6°F | Ht 62.0 in | Wt 149.0 lb

## 2022-04-08 DIAGNOSIS — E1169 Type 2 diabetes mellitus with other specified complication: Secondary | ICD-10-CM

## 2022-04-08 DIAGNOSIS — Z0001 Encounter for general adult medical examination with abnormal findings: Secondary | ICD-10-CM | POA: Diagnosis not present

## 2022-04-08 DIAGNOSIS — E042 Nontoxic multinodular goiter: Secondary | ICD-10-CM

## 2022-04-08 DIAGNOSIS — Z23 Encounter for immunization: Secondary | ICD-10-CM

## 2022-04-08 DIAGNOSIS — D649 Anemia, unspecified: Secondary | ICD-10-CM

## 2022-04-08 DIAGNOSIS — E118 Type 2 diabetes mellitus with unspecified complications: Secondary | ICD-10-CM | POA: Diagnosis not present

## 2022-04-08 DIAGNOSIS — E785 Hyperlipidemia, unspecified: Secondary | ICD-10-CM

## 2022-04-08 LAB — COMPREHENSIVE METABOLIC PANEL
ALT: 13 U/L (ref 0–35)
AST: 13 U/L (ref 0–37)
Albumin: 4.4 g/dL (ref 3.5–5.2)
Alkaline Phosphatase: 200 U/L — ABNORMAL HIGH (ref 39–117)
BUN: 11 mg/dL (ref 6–23)
CO2: 28 mEq/L (ref 19–32)
Calcium: 9.4 mg/dL (ref 8.4–10.5)
Chloride: 99 mEq/L (ref 96–112)
Creatinine, Ser: 0.62 mg/dL (ref 0.40–1.20)
GFR: 96.09 mL/min (ref >60.00)
Glucose, Bld: 266 mg/dL — ABNORMAL HIGH (ref 70–99)
Potassium: 3.8 mEq/L (ref 3.5–5.1)
Sodium: 135 mEq/L (ref 135–145)
Total Bilirubin: 0.6 mg/dL (ref 0.2–1.2)
Total Protein: 7.6 g/dL (ref 6.0–8.3)

## 2022-04-08 LAB — LIPID PANEL
Cholesterol: 269 mg/dL — ABNORMAL HIGH (ref 0–200)
HDL: 85.4 mg/dL (ref >39.00)
LDL Cholesterol: 164 mg/dL — ABNORMAL HIGH (ref 0–99)
NonHDL: 183.43
Total CHOL/HDL Ratio: 3
Triglycerides: 96 mg/dL (ref 0.0–149.0)
VLDL: 19.2 mg/dL (ref 0.0–40.0)

## 2022-04-08 LAB — CBC
HCT: 40 % (ref 36.0–46.0)
Hemoglobin: 12.8 g/dL (ref 12.0–15.0)
MCHC: 32.1 g/dL (ref 30.0–36.0)
MCV: 72 fl — ABNORMAL LOW (ref 78.0–100.0)
Platelets: 342 10*3/uL (ref 150.0–400.0)
RBC: 5.55 Mil/uL — ABNORMAL HIGH (ref 3.87–5.11)
RDW: 16.6 % — ABNORMAL HIGH (ref 11.5–15.5)
WBC: 4.9 10*3/uL (ref 4.0–10.5)

## 2022-04-08 LAB — MICROALBUMIN / CREATININE URINE RATIO
Creatinine,U: 101.9 mg/dL
Microalb Creat Ratio: 2.6 mg/g (ref 0.0–30.0)
Microalb, Ur: 2.7 mg/dL — ABNORMAL HIGH (ref 0.0–1.9)

## 2022-04-08 LAB — TSH: TSH: 0.8 u[IU]/mL (ref 0.35–5.50)

## 2022-04-08 LAB — HEMOGLOBIN A1C: Hgb A1c MFr Bld: 12.5 % — ABNORMAL HIGH (ref 4.6–6.5)

## 2022-04-08 MED ORDER — METFORMIN HCL ER 750 MG PO TB24: 750.0000 mg | ORAL_TABLET | Freq: Every day | ORAL | 3 refills | Status: DC

## 2022-04-08 MED ORDER — LEVOTHYROXINE SODIUM 50 MCG PO TABS: 50.0000 ug | ORAL_TABLET | Freq: Every day | ORAL | 3 refills | Status: DC

## 2022-04-08 MED ORDER — FREESTYLE LIBRE READER DEVI: 0 refills | Status: AC

## 2022-04-08 MED ORDER — GLIMEPIRIDE 2 MG PO TABS: 2.0000 mg | ORAL_TABLET | Freq: Every day | ORAL | 3 refills | Status: DC

## 2022-04-08 MED ORDER — ACCU-CHEK GUIDE VI STRP: 1.0000 | ORAL_STRIP | Freq: Every day | 3 refills | Status: DC

## 2022-04-08 MED ORDER — SIMVASTATIN 20 MG PO TABS: 20.0000 mg | ORAL_TABLET | Freq: Every day | ORAL | 3 refills | Status: DC

## 2022-04-08 MED ORDER — FREESTYLE LIBRE 3 SENSOR MISC: 11 refills | Status: DC

## 2022-04-08 MED ORDER — MONTELUKAST SODIUM 10 MG PO TABS: 10.0000 mg | ORAL_TABLET | Freq: Every day | ORAL | 3 refills | Status: DC

## 2022-04-08 NOTE — Patient Instructions (Signed)
We have sent in the freestyle monitor to watch sugars continuously to help with sugar control.

## 2022-04-08 NOTE — Assessment & Plan Note (Signed)
Checking TSH and adjust synthroid 50 mcg daily as needed. 

## 2022-04-08 NOTE — Assessment & Plan Note (Signed)
Previously uncontrolled. She has not been monitoring sugars lately due to lack of supplies. She has checked and cgm is covered. Rx freestyle libre 3 as I think this will help her control. She is having a hard time findings patterns of sugars with her limited testing ability. Checking HgA1c, cmp, microalbumin to creatinine ratio. Foot exam done. Eye exam getting records from [facility]. Adjust metformin 750 mg daily and amaryl 2 mg daily as needed. Is on statin.

## 2022-04-08 NOTE — Assessment & Plan Note (Signed)
Checking CBC. Adjust as needed.  

## 2022-04-08 NOTE — Assessment & Plan Note (Signed)
Flu shot given. Covid-19 counseled. Shingrix given 2nd today to complete series late. Tetanus declines. Colonoscopy up to date. Mammogram up to date, pap smear up to date. Counseled about sun safety and mole surveillance. Counseled about the dangers of distracted driving. Given 10 year screening recommendations.

## 2022-04-08 NOTE — Assessment & Plan Note (Signed)
Goal LDL <100. Checking lipid panel today and adjust simvastatin 20 mg daily as needed.

## 2022-04-08 NOTE — Progress Notes (Signed)
   Subjective:   Patient ID: Nicole Gallagher, female    DOB: [birth year], 61 y.o.   MRN: [id number]  HPI The patient is here for physical.  PMH, [facility], social history reviewed and updated  Review of Systems  Constitutional: Negative.   HENT: Negative.    Eyes: Negative.   Respiratory:  Negative for cough, chest tightness and shortness of breath.   Cardiovascular:  Negative for chest pain, palpitations and leg swelling.  Gastrointestinal:  Negative for abdominal distention, abdominal pain, constipation, diarrhea, nausea and vomiting.  Musculoskeletal: Negative.   Skin: Negative.   Neurological: Negative.   Psychiatric/Behavioral: Negative.      Objective:  Physical Exam Constitutional:      Appearance: She is well-developed.  HENT:     Head: Normocephalic and atraumatic.  Cardiovascular:     Rate and Rhythm: Normal rate and regular rhythm.  Pulmonary:     Effort: Pulmonary effort is normal. No respiratory distress.     Breath sounds: Normal breath sounds. No wheezing or rales.  Abdominal:     General: Bowel sounds are normal. There is no distension.     Palpations: Abdomen is soft.     Tenderness: There is no abdominal tenderness. There is no rebound.  Musculoskeletal:     Cervical back: Normal range of motion.  Skin:    General: Skin is warm and dry.     Comments: Foot exam done  Neurological:     Mental Status: She is alert and oriented to person, place, and time.     Coordination: Coordination normal.     Vitals:   04/08/22 0819 04/08/22 0826  BP: (!) 180/80 (!) 140/60  Pulse: 100   Temp: 97.6 F (36.4 C)   TempSrc: Oral   SpO2: 99%   Weight: 149 lb (67.6 kg)   Height: 5\' 2"  (1.575 m)     Assessment & Plan:  Flu and shingrix IM given at visit

## 2022-04-09 ENCOUNTER — Other Ambulatory Visit: Payer: Self-pay | Admitting: Internal Medicine

## 2022-04-09 MED ORDER — GLIMEPIRIDE 4 MG PO TABS: 4.0000 mg | ORAL_TABLET | Freq: Every day | ORAL | 1 refills | Status: DC

## 2022-04-09 MED ORDER — ATORVASTATIN CALCIUM 20 MG PO TABS: 20.0000 mg | ORAL_TABLET | Freq: Every day | ORAL | 3 refills | Status: DC

## 2022-04-09 MED ORDER — METFORMIN HCL ER 750 MG PO TB24: 1500.0000 mg | ORAL_TABLET | Freq: Every day | ORAL | 3 refills | Status: DC

## 2022-07-08 ENCOUNTER — Ambulatory Visit: Payer: BC Managed Care – PPO | Admitting: Internal Medicine

## 2022-10-18 DIAGNOSIS — Z1231 Encounter for screening mammogram for malignant neoplasm of breast: Secondary | ICD-10-CM | POA: Diagnosis not present

## 2022-10-18 DIAGNOSIS — Z01419 Encounter for gynecological examination (general) (routine) without abnormal findings: Secondary | ICD-10-CM | POA: Diagnosis not present

## 2022-10-18 DIAGNOSIS — Z6826 Body mass index (BMI) 26.0-26.9, adult: Secondary | ICD-10-CM | POA: Diagnosis not present

## 2022-12-09 ENCOUNTER — Ambulatory Visit: Payer: BC Managed Care – PPO | Admitting: Internal Medicine

## 2022-12-22 ENCOUNTER — Ambulatory Visit (INDEPENDENT_AMBULATORY_CARE_PROVIDER_SITE_OTHER): Payer: BC Managed Care – PPO | Admitting: Internal Medicine

## 2022-12-22 ENCOUNTER — Encounter: Payer: Self-pay | Admitting: Internal Medicine

## 2022-12-22 VITALS — BP 130/84 | HR 119 | Temp 98.3°F | Ht 62.0 in | Wt 145.0 lb

## 2022-12-22 DIAGNOSIS — R0789 Other chest pain: Secondary | ICD-10-CM | POA: Diagnosis not present

## 2022-12-22 MED ORDER — METFORMIN HCL ER 750 MG PO TB24: 1500.0000 mg | ORAL_TABLET | Freq: Every day | ORAL | 3 refills | Status: DC

## 2022-12-22 MED ORDER — ATORVASTATIN CALCIUM 20 MG PO TABS: 20.0000 mg | ORAL_TABLET | Freq: Every day | ORAL | 3 refills | Status: AC

## 2022-12-22 MED ORDER — LEVOTHYROXINE SODIUM 50 MCG PO TABS: 50.0000 ug | ORAL_TABLET | Freq: Every day | ORAL | 3 refills | Status: DC

## 2022-12-22 MED ORDER — GLIMEPIRIDE 4 MG PO TABS: 4.0000 mg | ORAL_TABLET | Freq: Every day | ORAL | 3 refills | Status: DC

## 2022-12-22 MED ORDER — MONTELUKAST SODIUM 10 MG PO TABS: 10.0000 mg | ORAL_TABLET | Freq: Every day | ORAL | 3 refills | Status: AC

## 2022-12-22 NOTE — Patient Instructions (Signed)
The EKG is normal. We will get the stress test on the treadmill to check the heart.

## 2022-12-22 NOTE — Assessment & Plan Note (Signed)
She is high risk for CV disease with diabetes. EKG done today which is not changed from prior. Ordered stress test she is able to do treadmill to assess risk for ischemic disease.

## 2022-12-22 NOTE — Progress Notes (Signed)
   Subjective:   Patient ID: Nicole Gallagher, female    DOB: 07-21-1960, 62 y.o.   MRN: [id number]  HPI The patient is a 62 YO female coming in for chest discomfort and some other concerns. Chest discomfort happened twice and randomly. Not during exercise.   Review of Systems  Constitutional: Negative.   HENT: Negative.    Eyes: Negative.   Respiratory:  Positive for chest tightness. Negative for cough and shortness of breath.   Cardiovascular:  Negative for chest pain, palpitations and leg swelling.  Gastrointestinal:  Negative for abdominal distention, abdominal pain, constipation, diarrhea, nausea and vomiting.  Musculoskeletal: Negative.   Skin: Negative.   Neurological: Negative.   Psychiatric/Behavioral: Negative.      Objective:  Physical Exam Constitutional:      Appearance: She is well-developed.  HENT:     Head: Normocephalic and atraumatic.  Cardiovascular:     Rate and Rhythm: Normal rate and regular rhythm.  Pulmonary:     Effort: Pulmonary effort is normal. No respiratory distress.     Breath sounds: Normal breath sounds. No wheezing or rales.  Abdominal:     General: Bowel sounds are normal. There is no distension.     Palpations: Abdomen is soft.     Tenderness: There is no abdominal tenderness. There is no rebound.  Musculoskeletal:     Cervical back: Normal range of motion.  Skin:    General: Skin is warm and dry.  Neurological:     Mental Status: She is alert and oriented to person, place, and time.     Coordination: Coordination normal.     Vitals:   12/22/22 0841  BP: 130/84  Pulse: (!) 119  Temp: 98.3 F (36.8 C)  TempSrc: Oral  SpO2: 97%  Weight: 145 lb (65.8 kg)  Height: 5\' 2"  (1.575 m)   EKG: Rate 99, axis normal, interval normal, sinus, possible LAE, no st or t wave changes, no significant change compared to prior 2023   Assessment & Plan:

## 2022-12-29 ENCOUNTER — Other Ambulatory Visit (HOSPITAL_COMMUNITY): Payer: Self-pay

## 2022-12-29 ENCOUNTER — Telehealth: Payer: Self-pay

## 2022-12-29 NOTE — Telephone Encounter (Signed)
*  Primary  Pharmacy Patient Advocate Encounter   Received notification from CoverMyMeds that prior authorization for FreeStyle Libre 3 Sensor  is required/requested.   Insurance verification completed.   The patient is insured through [facility] .   Per test claim:  Nicole Gallagher is preferred by the insurance.  If suggested medication is appropriate, Please send in a new RX and discontinue this one. If not, please advise as to why it's not appropriate so that we may request a Prior Authorization.

## 2023-01-01 DIAGNOSIS — E119 Type 2 diabetes mellitus without complications: Secondary | ICD-10-CM | POA: Diagnosis not present

## 2023-01-01 LAB — HM DIABETES EYE EXAM

## 2023-01-04 ENCOUNTER — Ambulatory Visit: Payer: BC Managed Care – PPO | Attending: Internal Medicine

## 2023-01-04 ENCOUNTER — Encounter: Payer: Self-pay | Admitting: Internal Medicine

## 2023-01-04 DIAGNOSIS — R0789 Other chest pain: Secondary | ICD-10-CM | POA: Diagnosis not present

## 2023-01-07 LAB — EXERCISE TOLERANCE TEST
Angina Index: 0
Base ST Depression (mm): 0 mm
Duke Treadmill Score: 8
Estimated workload: 9.4
Exercise duration (min): 7 min
Exercise duration (sec): 34 s
MPHR: 158 {beats}/min
Peak HR: 169 {beats}/min
Percent HR: 106 %
RPE: 17
Rest HR: 100 {beats}/min
ST Depression (mm): 0 mm

## 2023-05-11 ENCOUNTER — Other Ambulatory Visit: Payer: Self-pay | Admitting: Internal Medicine

## 2023-05-11 IMAGING — CT CT ABD-PELV W/O CM
2 of 6 series · 15 of 46 positions shown, 17 images · non-contrast
Comparison: Pelvic radiograph 12/14/2017

CLINICAL DATA: Postop day 1 for bilateral salpingo-ophorectomy and
diagnostic laparoscopy. Lower abdominal pain. Tachycardia and
anemia.



[Series 3: thins · axial · 0.78mm/px · z∈[+1010,+1376]mm · 12 of 801 slices shown, 14 images]
[im 34/801  soft-tissue]
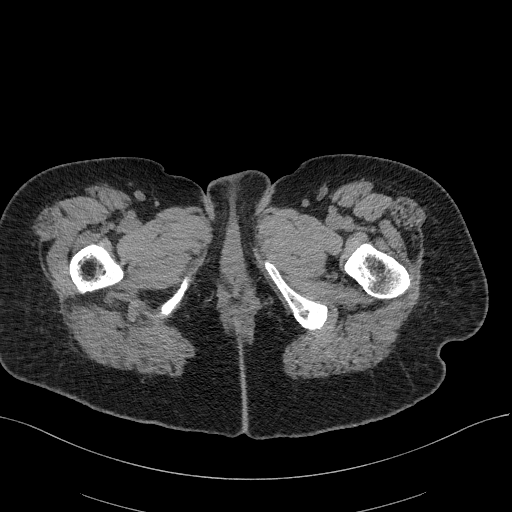
[im 34/801  bone]
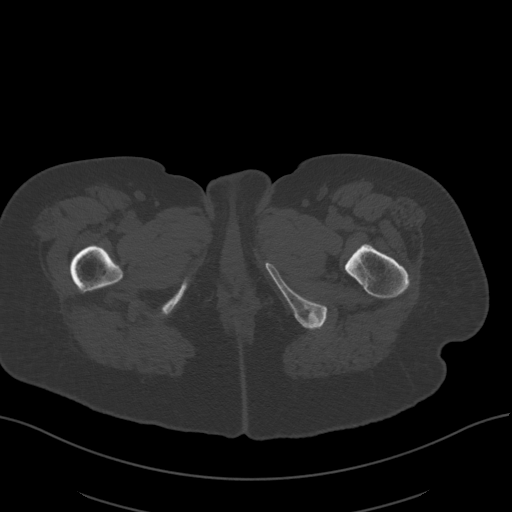
[im 101/801  soft-tissue]
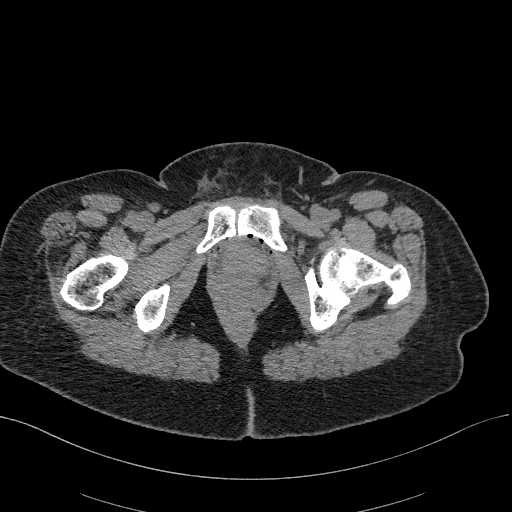
[im 167/801  soft-tissue]
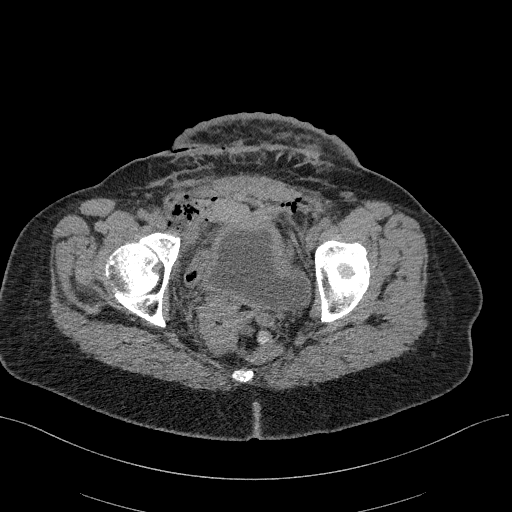
[im 234/801  soft-tissue]
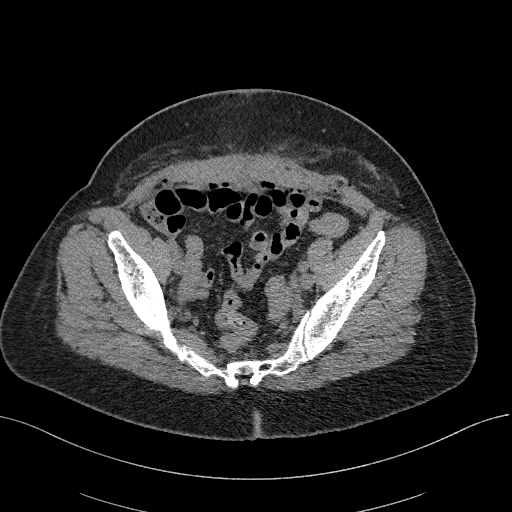
[im 301/801  soft-tissue]
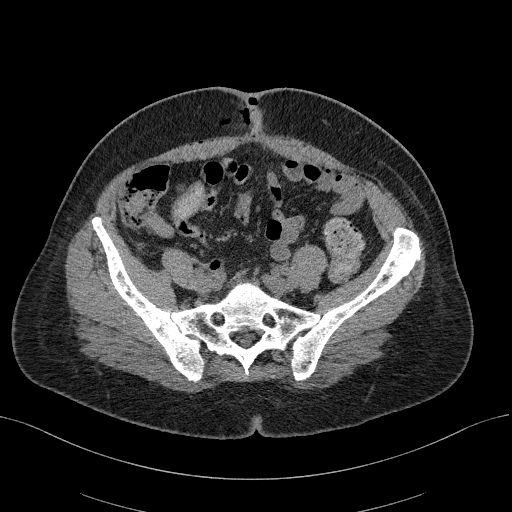
[im 367/801  soft-tissue]
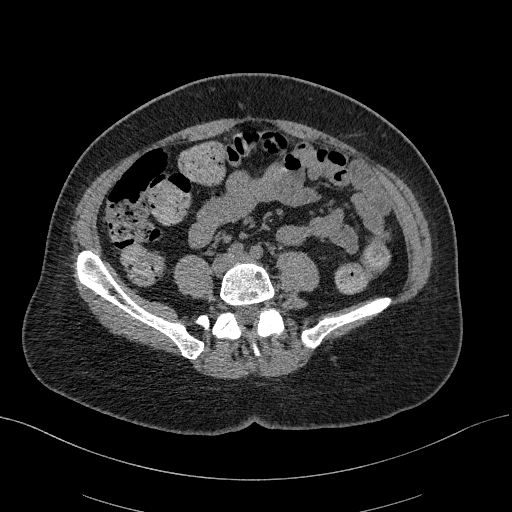
[im 434/801  soft-tissue]
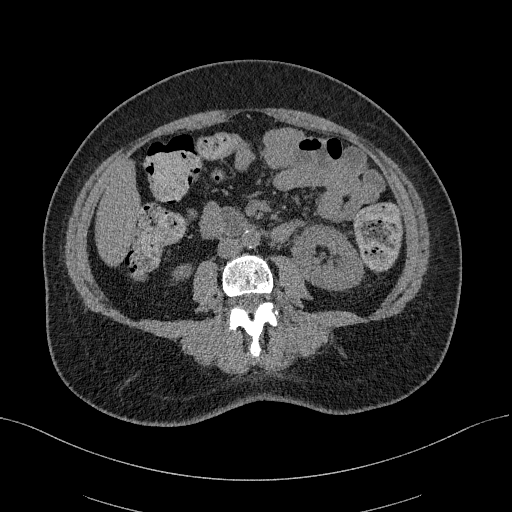
[im 501/801  soft-tissue]
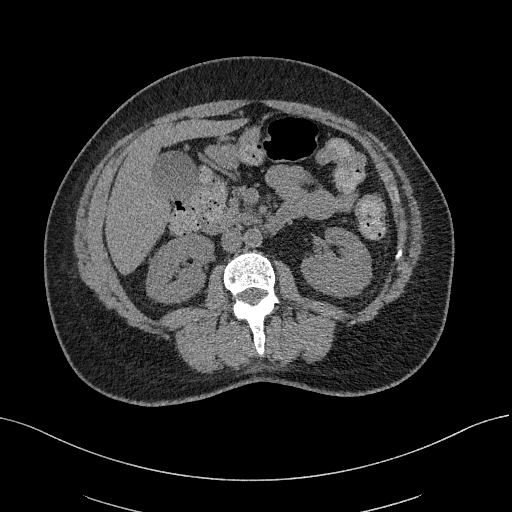
[im 567/801  soft-tissue]
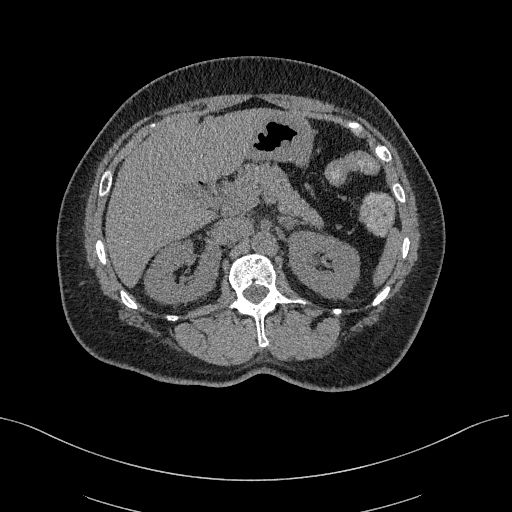
[im 567/801  bone]
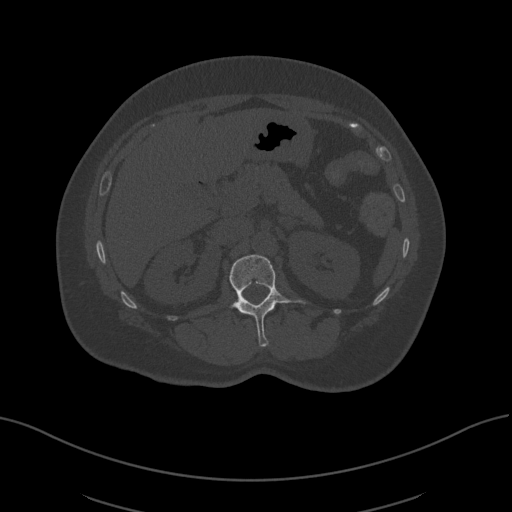
[im 634/801  soft-tissue]
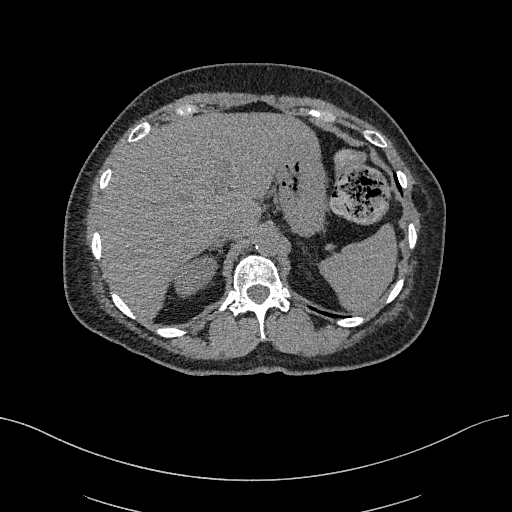
[im 701/801  soft-tissue]
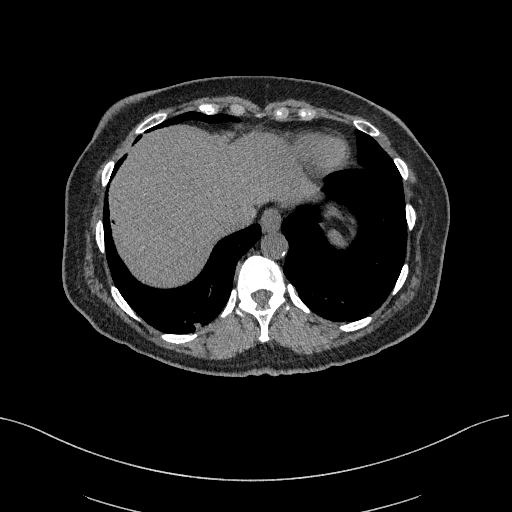
[im 767/801  soft-tissue]
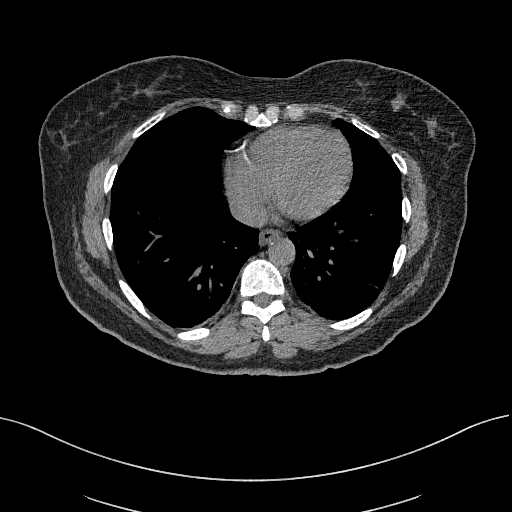

[Series 5: coronal st · coronal · 0.70mm/px · 3 of 101 slices shown]
[im 34/101  soft-tissue]
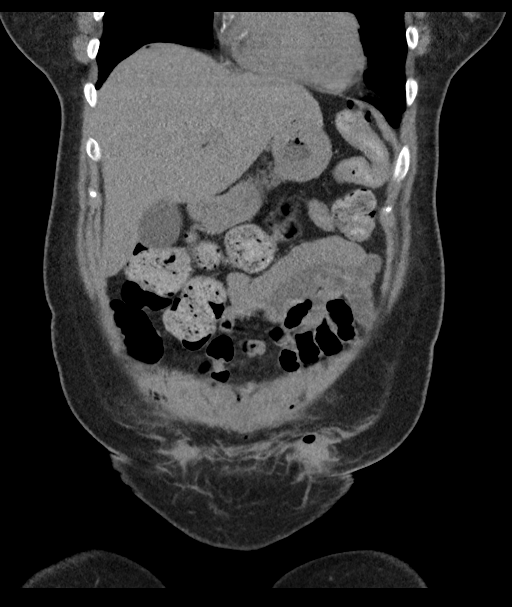
[im 45/101  soft-tissue]
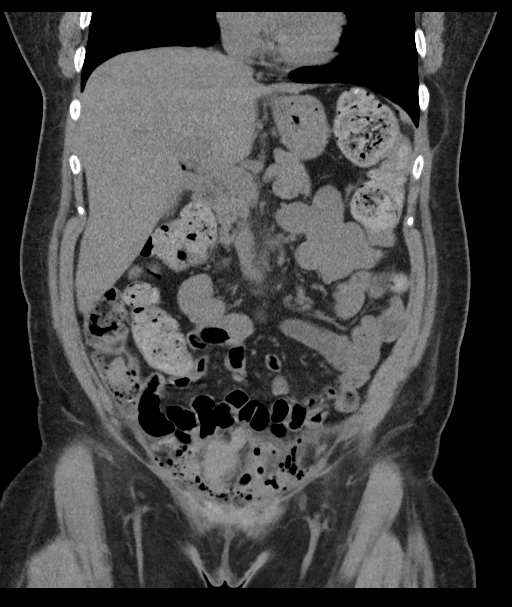
[im 56/101  soft-tissue]
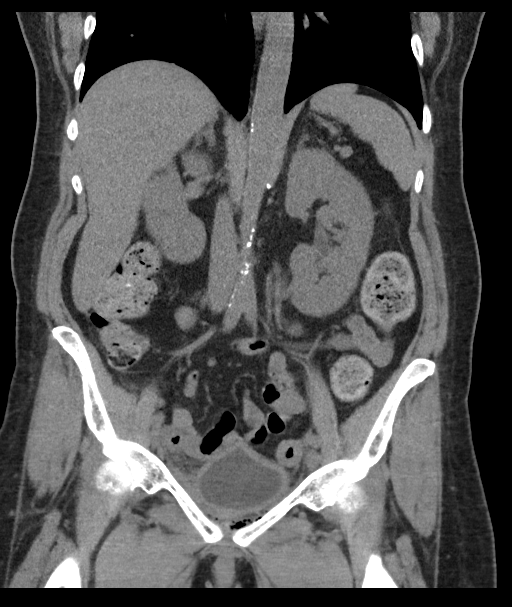

[15 of 46 positions shown; findings below may reference images not displayed]

FINDINGS: Lower chest: Descending thoracic aortic and right coronary artery
atherosclerotic calcification. Low-density blood pool compatible
with anemia. Linear subsegmental atelectasis or scarring in both
lower lobes. Small lipoma along the upper margin of the left lateral
abdominal wall musculature, image 16 series 2.

Hepatobiliary: Unremarkable

Pancreas: Unremarkable

Spleen: Unremarkable

Adrenals/Urinary Tract: Nonobstructive bilateral 3 mm right mid
kidney and left mid kidney calculi. No hydronephrosis or
hydroureter. Both adrenal glands appear normal. The urinary bladder
itself appears grossly unremarkable although there is gas and likely
some hematoma in the space of Retzius, with suspected hematoma
measuring about 5.4 by 1.7 by 3.1 cm (volume = 15 cm^3) in this
region.

Stomach/Bowel: No dilated bowel or substantial bowel wall thickening
identified. There is a trace amount of free intraperitoneal gas not
considered substantially abnormal on postop day 1, and accordingly
not considered directly attributable to viscus perforation.

Vascular/Lymphatic: Atherosclerosis is present, including aortoiliac
atherosclerotic disease.

Reproductive: The uterus and ovaries are absent. No significant
abnormality in the vicinity of the vaginal cuff.

Other: As noted above, there are numerous loculations of gas in the
space of Retzius along with approximately 15 cc hematoma along the
space of Retzius. No substantial ascites

Musculoskeletal: Infraumbilical gas along with small locules of gas
along a limited Pfannenstiel incision noted, likely incidental
postoperative appearance. No large hematoma along the incision or
trocar sites. Expected subcutaneous edema along the limited
Pfannenstiel incision. There is likely some inflammation and mild
thickening of the lower rectus abdominus muscles but no large
hematoma or substantially symmetry. No substantial hematoma along
the gluteal musculature or upper thigh musculature noted.
IMPRESSION: 1. There is an approximately 15 cc hematoma along with scattered gas
locules in the space of Retzius. No large hematoma is identified in
the pelvis to account for the patient's anemia.
2. Small loculations of gas along the limited Pfannenstiel incision
and along the infraumbilical trocar site, likely incidental
postoperative findings. There is also a trace amount of residual
intraperitoneal gas adjacent to the liver, not considered
substantially abnormal on postop day 1.
3. Small single bilateral nonobstructive renal calculi.
4. Other imaging findings of potential clinical significance: Aortic
Atherosclerosis ([id number]). Right coronary artery
atherosclerosis. Linear subsegmental atelectasis or scarring in both
lower lobes.

## 2023-07-20 DIAGNOSIS — L814 Other melanin hyperpigmentation: Secondary | ICD-10-CM | POA: Diagnosis not present

## 2023-07-20 DIAGNOSIS — L2084 Intrinsic (allergic) eczema: Secondary | ICD-10-CM | POA: Diagnosis not present

## 2023-07-20 DIAGNOSIS — L509 Urticaria, unspecified: Secondary | ICD-10-CM | POA: Diagnosis not present

## 2023-08-08 ENCOUNTER — Telehealth: Payer: Self-pay

## 2023-08-08 NOTE — Telephone Encounter (Signed)
 Patient was identified as falling into the [place] Measure - Diabetes.   Patient was: Left voicemail to schedule with primary care provider.

## 2023-09-05 ENCOUNTER — Telehealth: Payer: Self-pay

## 2023-09-05 NOTE — Telephone Encounter (Signed)
 Patient was identified as falling into the [place] Measure - Diabetes.   Patient was: Appointment scheduled with primary care provider in the next 30 days.

## 2023-09-20 ENCOUNTER — Ambulatory Visit (INDEPENDENT_AMBULATORY_CARE_PROVIDER_SITE_OTHER): Admitting: Internal Medicine

## 2023-09-20 ENCOUNTER — Encounter: Payer: Self-pay | Admitting: Internal Medicine

## 2023-09-20 VITALS — BP 126/82 | HR 80 | Temp 97.9°F | Ht 62.0 in | Wt 148.0 lb

## 2023-09-20 DIAGNOSIS — E118 Type 2 diabetes mellitus with unspecified complications: Secondary | ICD-10-CM | POA: Diagnosis not present

## 2023-09-20 DIAGNOSIS — Z0001 Encounter for general adult medical examination with abnormal findings: Secondary | ICD-10-CM | POA: Diagnosis not present

## 2023-09-20 DIAGNOSIS — E1169 Type 2 diabetes mellitus with other specified complication: Secondary | ICD-10-CM

## 2023-09-20 DIAGNOSIS — E785 Hyperlipidemia, unspecified: Secondary | ICD-10-CM

## 2023-09-20 DIAGNOSIS — E042 Nontoxic multinodular goiter: Secondary | ICD-10-CM | POA: Diagnosis not present

## 2023-09-20 LAB — COMPREHENSIVE METABOLIC PANEL WITH GFR
ALT: 13 U/L (ref 0–35)
AST: 14 U/L (ref 0–37)
Albumin: 4.4 g/dL (ref 3.5–5.2)
Alkaline Phosphatase: 158 U/L — ABNORMAL HIGH (ref 39–117)
BUN: 11 mg/dL (ref 6–23)
CO2: 25 meq/L (ref 19–32)
Calcium: 9.7 mg/dL (ref 8.4–10.5)
Chloride: 105 meq/L (ref 96–112)
Creatinine, Ser: 0.55 mg/dL (ref 0.40–1.20)
GFR: 97.9 mL/min (ref >60.00)
Glucose, Bld: 118 mg/dL — ABNORMAL HIGH (ref 70–99)
Potassium: 4 meq/L (ref 3.5–5.1)
Sodium: 140 meq/L (ref 135–145)
Total Bilirubin: 0.6 mg/dL (ref 0.2–1.2)
Total Protein: 7.8 g/dL (ref 6.0–8.3)

## 2023-09-20 LAB — POCT GLYCOSYLATED HEMOGLOBIN (HGB A1C): HbA1c POC (<> result, manual entry): 9.7 % (ref 4.0–5.6)

## 2023-09-20 LAB — CBC
HCT: 39.3 % (ref 36.0–46.0)
Hemoglobin: 12.7 g/dL (ref 12.0–15.0)
MCHC: 32.3 g/dL (ref 30.0–36.0)
MCV: 71.6 fl — ABNORMAL LOW (ref 78.0–100.0)
Platelets: 355 10*3/uL (ref 150.0–400.0)
RBC: 5.49 Mil/uL — ABNORMAL HIGH (ref 3.87–5.11)
RDW: 16.1 % — ABNORMAL HIGH (ref 11.5–15.5)
WBC: 4.9 10*3/uL (ref 4.0–10.5)

## 2023-09-20 LAB — LIPID PANEL
Cholesterol: 286 mg/dL — ABNORMAL HIGH (ref 0–200)
HDL: 71.2 mg/dL (ref >39.00)
LDL Cholesterol: 189 mg/dL — ABNORMAL HIGH (ref 0–99)
NonHDL: 214.99
Total CHOL/HDL Ratio: 4
Triglycerides: 130 mg/dL (ref 0.0–149.0)
VLDL: 26 mg/dL (ref 0.0–40.0)

## 2023-09-20 LAB — TSH: TSH: 0.41 u[IU]/mL (ref 0.35–5.50)

## 2023-09-20 LAB — MICROALBUMIN / CREATININE URINE RATIO
Creatinine,U: 144.4 mg/dL
Microalb Creat Ratio: 11.7 mg/g (ref 0.0–30.0)
Microalb, Ur: 1.7 mg/dL (ref 0.0–1.9)

## 2023-09-20 LAB — T4, FREE: Free T4: 0.9 ng/dL (ref 0.60–1.60)

## 2023-09-20 NOTE — Assessment & Plan Note (Signed)
 Flu shot yearly. Pneumonia declines. Shingrix  complete. Tetanus declines. Colonoscopy up to date. Mammogram overdue has upcoming with gyn, pap smear likely due has upcoming with gyn. Counseled about sun safety and mole surveillance. Counseled about the dangers of distracted driving. Given 10 year screening recommendations.

## 2023-09-20 NOTE — Assessment & Plan Note (Signed)
Checking lipid panel and adjust lipitor as needed.  

## 2023-09-20 NOTE — Assessment & Plan Note (Signed)
 She is on levothyroxine  50 mcg daily for suppression. Checking TSH and adjust as needed.

## 2023-09-20 NOTE — Progress Notes (Signed)
   Subjective:   Patient ID: Nicole Gallagher, female    DOB: [birth year], 63 y.o.   MRN: [id number]  HPI The patient is here for physical.  PMH, [facility], social history reviewed and updated  Review of Systems  Constitutional: Negative.   HENT: Negative.    Eyes: Negative.   Respiratory:  Negative for cough, chest tightness and shortness of breath.   Cardiovascular:  Negative for chest pain, palpitations and leg swelling.  Gastrointestinal:  Negative for abdominal distention, abdominal pain, constipation, diarrhea, nausea and vomiting.  Musculoskeletal: Negative.   Skin: Negative.   Neurological: Negative.   Psychiatric/Behavioral: Negative.      Objective:  Physical Exam Constitutional:      Appearance: She is well-developed.  HENT:     Head: Normocephalic and atraumatic.  Cardiovascular:     Rate and Rhythm: Normal rate and regular rhythm.  Pulmonary:     Effort: Pulmonary effort is normal. No respiratory distress.     Breath sounds: Normal breath sounds. No wheezing or rales.  Abdominal:     General: Bowel sounds are normal. There is no distension.     Palpations: Abdomen is soft.     Tenderness: There is no abdominal tenderness. There is no rebound.  Musculoskeletal:     Cervical back: Normal range of motion.  Skin:    General: Skin is warm and dry.  Neurological:     Mental Status: She is alert and oriented to person, place, and time.     Coordination: Coordination normal.     Vitals:   09/20/23 0858  BP: 126/82  Pulse: 80  Temp: 97.9 F (36.6 C)  TempSrc: Oral  SpO2: 98%  Weight: 148 lb (67.1 kg)  Height: 5\' 2"  (1.575 m)    Assessment & Plan:

## 2023-09-20 NOTE — Assessment & Plan Note (Addendum)
 Previously poorly controlled. POC HGA1c done and improved but still poorly controlled at 9.7. she is using CGM and would like to keep meds and focus on diet. She is having neuropathy in her feet. We discussed risk of complications which is rising given poorly controlled for some time now. Follow up 3 months and she is willing to make a change if not at goal 7-7.5. Checking lipid panel and CMP and UACR. Taking metformin  and amaryl . On statin.

## 2023-09-20 NOTE — Patient Instructions (Signed)
 The sugars are 9.7 today which is too high . The goal for you is around 7 but definitely under 8.

## 2023-09-21 ENCOUNTER — Ambulatory Visit: Payer: Self-pay | Admitting: Internal Medicine

## 2023-12-21 ENCOUNTER — Ambulatory Visit: Admitting: Internal Medicine

## 2024-01-13 ENCOUNTER — Other Ambulatory Visit: Payer: Self-pay | Admitting: Internal Medicine

## 2024-01-23 ENCOUNTER — Ambulatory Visit: Admitting: Internal Medicine

## 2024-01-26 ENCOUNTER — Ambulatory Visit (INDEPENDENT_AMBULATORY_CARE_PROVIDER_SITE_OTHER): Admitting: Pulmonary Disease

## 2024-01-26 ENCOUNTER — Ambulatory Visit (INDEPENDENT_AMBULATORY_CARE_PROVIDER_SITE_OTHER)

## 2024-01-26 ENCOUNTER — Encounter: Payer: Self-pay | Admitting: Pulmonary Disease

## 2024-01-26 VITALS — BP 127/82 | HR 96 | Ht 62.0 in | Wt 147.6 lb

## 2024-01-26 DIAGNOSIS — J40 Bronchitis, not specified as acute or chronic: Secondary | ICD-10-CM

## 2024-01-26 DIAGNOSIS — R062 Wheezing: Secondary | ICD-10-CM | POA: Diagnosis not present

## 2024-01-26 DIAGNOSIS — R059 Cough, unspecified: Secondary | ICD-10-CM | POA: Diagnosis not present

## 2024-01-26 DIAGNOSIS — R0602 Shortness of breath: Secondary | ICD-10-CM | POA: Diagnosis not present

## 2024-01-26 MED ORDER — PREDNISONE 10 MG PO TABS: ORAL_TABLET | ORAL | 0 refills | Status: AC

## 2024-01-26 MED ORDER — BUDESONIDE-FORMOTEROL FUMARATE 160-4.5 MCG/ACT IN AERO
2.0000 | INHALATION_SPRAY | Freq: Two times a day (BID) | RESPIRATORY_TRACT | 6 refills | Status: AC
Start: 2024-01-26 — End: ?

## 2024-01-26 NOTE — Patient Instructions (Addendum)
 We will check an x-ray today  We will check a CBC lab today  Start Symbicort  inhaler 160-4.5mcg 2 puffs twice daily - rinse mouth out after each use  Use albuterol inhaler 1-2 puffs every 4-6 hours as needed  Start prednisone  taper - 4 tabs daily x 3 days - 3 tabs daily x 3 days - 2 tabs daily x 3 days - 1 tab daily x 3 days  Follow up in 3 months, call sooner if needed

## 2024-01-26 NOTE — Progress Notes (Unsigned)
 Subjective:   PATIENT ID: Nicole Gallagher GENDER: female DOB: 28-Oct-1960, MRN: [id number]   HPI Discussed the use of AI scribe software for clinical note transcription with the patient, who gave verbal consent to proceed.  History of Present Illness   Nicole Gallagher is a 63 year old female who presents with a persistent cough for over a month.  She was diagnosed with bronchitis at an urgent care facility two weeks ago. Despite treatment with steroids, a breathing treatment, and antibiotics, the cough persists. It is severe, sometimes causing vomiting, and is accompanied by chest discomfort and light yellow mucus that is difficult to expel. The cough improved temporarily after the initial breathing treatment but returned after a few days. Exposure to irritants like Lysol at work exacerbates her symptoms, leading to severe coughing fits. Nighttime coughing occasionally wakes her up.  She has a history of a similar episode of bronchitis over ten years ago, which progressed to pneumonia. She has sinus issues and allergies, with a previous sinus surgery. She is allergic to dust, cats, dogs, and grass, and experiences year-round allergy symptoms. She takes montelukast  at night for her allergies.  She denies smoking or exposure to smoke in her home environment. She works in [employer] with no known exposure to dust or chemicals at her workplace.  Current medications include albuterol as needed, which provides some relief during severe coughing spells, but she continues to experience wheezing and coughing.  She experiences sinus congestion and post-nasal drip, which she associates with her allergies. She denies any recent cold or viral infection prior to the onset of her current symptoms. No heartburn or reflux.       Past Medical History:  Diagnosis Date   Allergy    Diabetes mellitus without complication (HCC)    Hypothyroidism      Family History  Problem Relation Age of Onset    Cancer Mother        cervical   Hypertension Father    Hyperlipidemia Father    Hypertension Maternal Aunt    Diabetes Maternal Aunt    Hypertension Maternal Uncle    Diabetes Maternal Uncle    Hypertension Paternal Aunt    Diabetes Paternal Aunt    Hypertension Paternal Uncle    Diabetes Paternal Uncle    Hypertension Maternal Grandmother    Diabetes Maternal Grandmother    Hypertension Maternal Grandfather    Diabetes Maternal Grandfather    Hypertension Paternal Grandmother    Diabetes Paternal Grandmother    Hypertension Paternal Grandfather    Diabetes Paternal Grandfather      Social History   Socioeconomic History   Marital status: Single    Spouse name: Not on file   Number of children: Not on file   Years of education: Not on file   Highest education level: Not on file  Occupational History   Not on file  Tobacco Use   Smoking status: Never   Smokeless tobacco: Never  Substance and Sexual Activity   Alcohol use: No    Alcohol/week: 0.0 standard drinks of alcohol   Drug use: No   Sexual activity: Yes    Birth control/protection: Post-menopausal  Other Topics Concern   Not on file  Social History Narrative   Not on file   Social Drivers of Health   Financial Resource Strain: Not on file  Food Insecurity: Not on file  Transportation Needs: Not on file  Physical Activity: Not on file  Stress: Not on file  Social Connections: Unknown (09/11/2021)   Received from [facility]   Social Network    Social Network: Not on file  Intimate Partner Violence: Unknown (08/03/2021)   Received from [facility]   HITS    Physically Hurt: Not on file    Insult or Talk Down To: Not on file    Threaten Physical Harm: Not on file    Scream or Curse: Not on file     No Known Allergies   Outpatient Medications Prior to Visit  Medication Sig Dispense Refill   atorvastatin  (LIPITOR) 20 MG tablet Take 1 tablet (20 mg total) by mouth daily. 90 tablet 3   Continuous  Blood Gluc Receiver (FREESTYLE LIBRE READER) DEVI Use for checking sugars 1 each 0   Continuous Glucose Sensor (FREESTYLE LIBRE 3 SENSOR) MISC PLACE 1 SENSOR ON THE SKIN EVERY 14 DAYS. USE TO CHECK GLUCOSE CONTINUOUSLY 2 each 11   glimepiride  (AMARYL ) 4 MG tablet TAKE 1 TABLET BY MOUTH DAILY BEFORE BREAKFAST. 90 tablet 0   levothyroxine  (SYNTHROID ) 50 MCG tablet TAKE 1 TABLET BY MOUTH EVERY DAY 90 tablet 0   metFORMIN  (GLUCOPHAGE -XR) 750 MG 24 hr tablet TAKE 2 TABLETS (1,500 MG TOTAL) BY MOUTH EVERY DAY WITH BREAKFAST 180 tablet 0   montelukast  (SINGULAIR ) 10 MG tablet Take 1 tablet (10 mg total) by mouth at bedtime. 90 tablet 3   No facility-administered medications prior to visit.   Review of Systems  Constitutional:  Negative for chills, fever, malaise/fatigue and weight loss.  HENT:  Negative for congestion, sinus pain and sore throat.   Eyes: Negative.   Respiratory:  Negative for cough, hemoptysis, sputum production, shortness of breath and wheezing.   Cardiovascular:  Negative for chest pain, palpitations, orthopnea, claudication and leg swelling.  Gastrointestinal:  Negative for abdominal pain, heartburn, nausea and vomiting.  Genitourinary: Negative.   Musculoskeletal:  Negative for joint pain and myalgias.  Skin:  Negative for rash.  Neurological:  Negative for weakness.  Endo/Heme/Allergies: Negative.   Psychiatric/Behavioral: Negative.     Objective:   Vitals:   01/26/24 1510  BP: 127/82  Pulse: 96  SpO2: 100%  Weight: 147 lb 9.6 oz (67 kg)  Height: 5' 2 (1.575 m)   Physical Exam Constitutional:      General: She is not in acute distress.    Appearance: Normal appearance.  Eyes:     General: No scleral icterus.    Conjunctiva/sclera: Conjunctivae normal.  Cardiovascular:     Rate and Rhythm: Normal rate and regular rhythm.  Pulmonary:     Breath sounds: Wheezing present. No rhonchi or rales.  Musculoskeletal:     Right lower leg: No edema.     Left lower  leg: No edema.  Skin:    General: Skin is warm and dry.  Neurological:     General: No focal deficit present.    CBC    Component Value Date/Time   WBC 5.5 01/26/2024 1622   RBC 5.47 (H) 01/26/2024 1622   HGB 12.6 01/26/2024 1622   HCT 39.0 01/26/2024 1622   PLT 352.0 01/26/2024 1622   MCV 71.3 (L) 01/26/2024 1622   MCH 23.2 (L) 07/08/2021 1349   MCHC 32.2 01/26/2024 1622   RDW 16.1 (H) 01/26/2024 1622   LYMPHSABS 2.1 01/26/2024 1622   MONOABS 0.5 01/26/2024 1622   EOSABS 0.2 01/26/2024 1622   BASOSABS 0.1 01/26/2024 1622     Chest imaging: CXR 01/26/24 - personal review No infiltrates or opacities. No  pleural effusions.    PFT:     No data to display          Labs:  Path:  Echo:  Heart Catheterization:       Assessment & Plan:   Bronchitis - Plan: DG Chest 2 View, CBC with Differential/Platelet, predniSONE  (DELTASONE ) 10 MG tablet, budesonide -formoterol  (SYMBICORT ) 160-4.5 MCG/ACT inhaler, CBC with Differential/Platelet Assessment and Plan    Chronic cough with wheezing and chest discomfort Persistent cough with wheezing and chest discomfort, exacerbated by environmental triggers. Differential includes bronchitis and potential asthma. No smoking history or significant environmental exposures. - chest radiograph unremarkable - Order complete blood count for eosinophils. - Prescribe prednisone  taper, noting side effects. - Prescribe Symbicort  inhaler, 2 puffs twice daily, advise rinsing mouth after use. - Continue albuterol inhaler as needed.  Allergic rhinitis with postnasal drip Year-round allergic rhinitis with postnasal drip contributing to cough. History of sinus surgery and allergies to dust, cats, dogs, and grass. - Continue montelukast  (Singulair ).     Follow up in 3 months  Dorn Chill, MD [facility] Office: [phone]   Current Outpatient Medications:    atorvastatin  (LIPITOR) 20 MG tablet, Take 1 tablet  (20 mg total) by mouth daily., Disp: 90 tablet, Rfl: 3   budesonide -formoterol  (SYMBICORT ) 160-4.5 MCG/ACT inhaler, Inhale 2 puffs into the lungs 2 (two) times daily., Disp: 1 each, Rfl: 6   Continuous Blood Gluc Receiver (FREESTYLE LIBRE READER) DEVI, Use for checking sugars, Disp: 1 each, Rfl: 0   Continuous Glucose Sensor (FREESTYLE LIBRE 3 SENSOR) MISC, PLACE 1 SENSOR ON THE SKIN EVERY 14 DAYS. USE TO CHECK GLUCOSE CONTINUOUSLY, Disp: 2 each, Rfl: 11   glimepiride  (AMARYL ) 4 MG tablet, TAKE 1 TABLET BY MOUTH DAILY BEFORE BREAKFAST., Disp: 90 tablet, Rfl: 0   levothyroxine  (SYNTHROID ) 50 MCG tablet, TAKE 1 TABLET BY MOUTH EVERY DAY, Disp: 90 tablet, Rfl: 0   metFORMIN  (GLUCOPHAGE -XR) 750 MG 24 hr tablet, TAKE 2 TABLETS (1,500 MG TOTAL) BY MOUTH EVERY DAY WITH BREAKFAST, Disp: 180 tablet, Rfl: 0   montelukast  (SINGULAIR ) 10 MG tablet, Take 1 tablet (10 mg total) by mouth at bedtime., Disp: 90 tablet, Rfl: 3   predniSONE  (DELTASONE ) 10 MG tablet, Take 4 tablets (40 mg total) by mouth daily with breakfast for 3 days, THEN 3 tablets (30 mg total) daily with breakfast for 3 days, THEN 2 tablets (20 mg total) daily with breakfast for 3 days, THEN 1 tablet (10 mg total) daily with breakfast for 3 days., Disp: 30 tablet, Rfl: 0

## 2024-01-27 ENCOUNTER — Encounter: Payer: Self-pay | Admitting: Pulmonary Disease

## 2024-01-27 ENCOUNTER — Ambulatory Visit: Payer: Self-pay | Admitting: Pulmonary Disease

## 2024-01-27 LAB — CBC WITH DIFFERENTIAL/PLATELET
Basophils Absolute: 0.1 K/uL (ref 0.0–0.1)
Basophils Relative: 0.9 % (ref 0.0–3.0)
Eosinophils Absolute: 0.2 K/uL (ref 0.0–0.7)
Eosinophils Relative: 4.4 % (ref 0.0–5.0)
HCT: 39 % (ref 36.0–46.0)
Hemoglobin: 12.6 g/dL (ref 12.0–15.0)
Lymphocytes Relative: 38.6 % (ref 12.0–46.0)
Lymphs Abs: 2.1 K/uL (ref 0.7–4.0)
MCHC: 32.2 g/dL (ref 30.0–36.0)
MCV: 71.3 fl — ABNORMAL LOW (ref 78.0–100.0)
Monocytes Absolute: 0.5 K/uL (ref 0.1–1.0)
Monocytes Relative: 8.2 % (ref 3.0–12.0)
Neutro Abs: 2.6 K/uL (ref 1.4–7.7)
Neutrophils Relative %: 47.9 % (ref 43.0–77.0)
Platelets: 352 K/uL (ref 150.0–400.0)
RBC: 5.47 Mil/uL — ABNORMAL HIGH (ref 3.87–5.11)
RDW: 16.1 % — ABNORMAL HIGH (ref 11.5–15.5)
WBC: 5.5 K/uL (ref 4.0–10.5)

## 2024-04-11 ENCOUNTER — Other Ambulatory Visit: Payer: Self-pay | Admitting: Internal Medicine
